# Patient Record
Sex: Female | Born: 2000 | Race: Black or African American | Hispanic: No | Marital: Single | Smoking: Never smoker
Health system: Southern US, Community
[De-identification: ages and names within clinical notes are randomized; demographics above are authoritative.]

## PROBLEM LIST (undated history)

## (undated) DIAGNOSIS — F909 Attention-deficit hyperactivity disorder, unspecified type: Secondary | ICD-10-CM

## (undated) DIAGNOSIS — K219 Gastro-esophageal reflux disease without esophagitis: Secondary | ICD-10-CM

## (undated) DIAGNOSIS — R011 Cardiac murmur, unspecified: Secondary | ICD-10-CM

---

## 2021-08-20 ENCOUNTER — Encounter: Payer: Self-pay | Admitting: *Deleted

## 2021-08-20 ENCOUNTER — Emergency Department
Admission: EM | Admit: 2021-08-20 | Discharge: 2021-08-20 | Disposition: A | Discharge status: Home / Self Care | Payer: Medicaid Other | Attending: Emergency Medicine | Admitting: Emergency Medicine

## 2021-08-20 ENCOUNTER — Emergency Department: Payer: Medicaid Other

## 2021-08-20 ENCOUNTER — Other Ambulatory Visit: Payer: Self-pay

## 2021-08-20 DIAGNOSIS — R55 Syncope and collapse: Secondary | ICD-10-CM | POA: Diagnosis present

## 2021-08-20 DIAGNOSIS — E860 Dehydration: Secondary | ICD-10-CM | POA: Diagnosis not present

## 2021-08-20 DIAGNOSIS — R1084 Generalized abdominal pain: Secondary | ICD-10-CM | POA: Diagnosis not present

## 2021-08-20 DIAGNOSIS — R112 Nausea with vomiting, unspecified: Secondary | ICD-10-CM | POA: Diagnosis not present

## 2021-08-20 DIAGNOSIS — L0501 Pilonidal cyst with abscess: Secondary | ICD-10-CM | POA: Diagnosis not present

## 2021-08-20 LAB — URINALYSIS, COMPLETE (UACMP) WITH MICROSCOPIC
Glucose, UA: NEGATIVE mg/dL
Hgb urine dipstick: NEGATIVE
Ketones, ur: 80 mg/dL — AB
Leukocytes,Ua: NEGATIVE
Nitrite: NEGATIVE
Protein, ur: 30 mg/dL — AB
Specific Gravity, Urine: >1.03 — ABNORMAL HIGH (ref 1.005–1.030)
pH: 6.5 (ref 5.0–8.0)

## 2021-08-20 LAB — HEPATIC FUNCTION PANEL
ALT: 11 U/L (ref 0–44)
AST: 15 U/L (ref 15–41)
Albumin: 4.1 g/dL (ref 3.5–5.0)
Alkaline Phosphatase: 42 U/L (ref 38–126)
Bilirubin, Direct: <0.1 mg/dL (ref 0.0–0.2)
Total Bilirubin: 0.9 mg/dL (ref 0.3–1.2)
Total Protein: 7.4 g/dL (ref 6.5–8.1)

## 2021-08-20 LAB — CBC
HCT: 36.4 % (ref 36.0–46.0)
Hemoglobin: 12.4 g/dL (ref 12.0–15.0)
MCH: 29.5 pg (ref 26.0–34.0)
MCHC: 34.1 g/dL (ref 30.0–36.0)
MCV: 86.5 fL (ref 80.0–100.0)
Platelets: 315 10*3/uL (ref 150–400)
RBC: 4.21 MIL/uL (ref 3.87–5.11)
RDW: 13.2 % (ref 11.5–15.5)
WBC: 12.5 10*3/uL — ABNORMAL HIGH (ref 4.0–10.5)
nRBC: 0 % (ref 0.0–0.2)

## 2021-08-20 LAB — BASIC METABOLIC PANEL
Anion gap: 9 (ref 5–15)
BUN: 10 mg/dL (ref 6–20)
CO2: 24 mmol/L (ref 22–32)
Calcium: 9.1 mg/dL (ref 8.9–10.3)
Chloride: 106 mmol/L (ref 98–111)
Creatinine, Ser: 0.79 mg/dL (ref 0.44–1.00)
GFR, Estimated: >60 mL/min (ref >60)
Glucose, Bld: 110 mg/dL — ABNORMAL HIGH (ref 70–99)
Potassium: 3.3 mmol/L — ABNORMAL LOW (ref 3.5–5.1)
Sodium: 139 mmol/L (ref 135–145)

## 2021-08-20 LAB — POC URINE PREG, ED: Preg Test, Ur: NEGATIVE

## 2021-08-20 LAB — LIPASE, BLOOD: Lipase: 19 U/L (ref 11–51)

## 2021-08-20 IMAGING — CT CT ABD-PELV W/ CM
2 of 4 series · 16 of 46 positions shown, 18 images · IV contrast (APPLIED)
Comparison: None.

CLINICAL DATA: Nonlocalized abdominal pain. Syncopal episode with
pain in lower back/sacral area.

EXAM:
CT ABDOMEN AND PELVIS WITH CONTRAST
TECHNIQUE: Multidetector CT imaging of the abdomen and pelvis was performed
using the standard protocol following bolus administration of
intravenous contrast.
CONTRAST:  75mL OMNIPAQUE IOHEXOL 350 MG/ML SOLN

[Series 2: routine abd/pel with · axial · 0.86mm/px · z∈[-990,-580]mm · 13 of 90 slices shown, 15 images]
[im 4/90  soft-tissue]
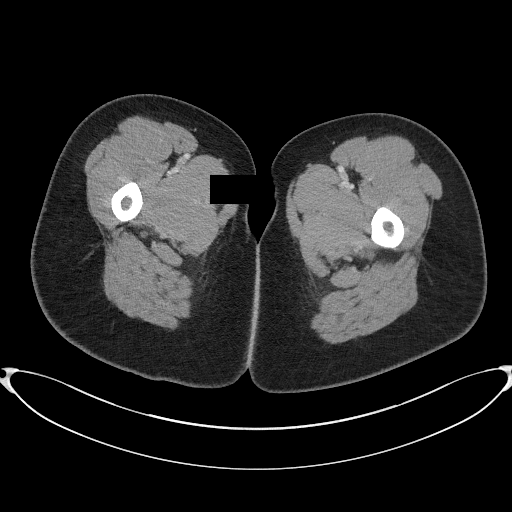
[im 4/90  bone]
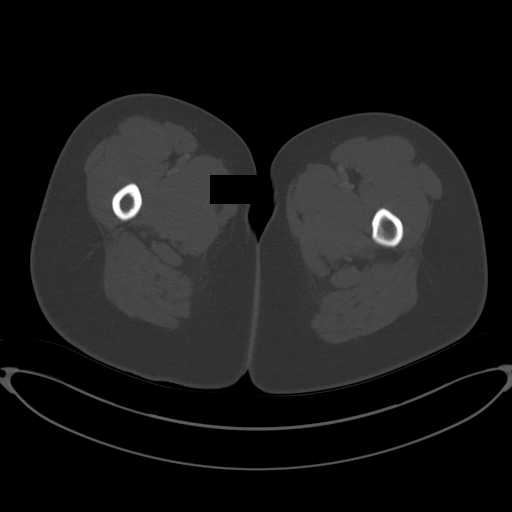
[im 12/90  soft-tissue]
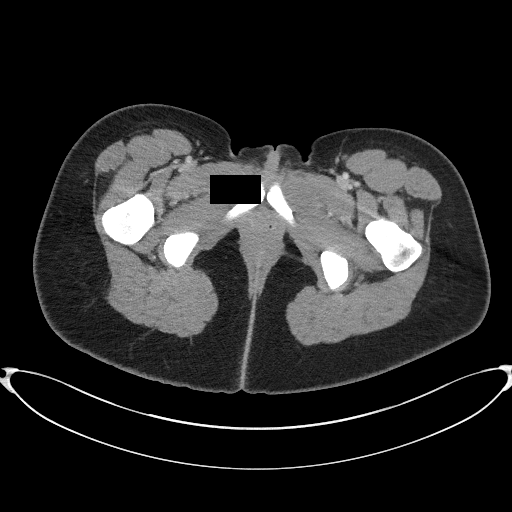
[im 19/90  soft-tissue]
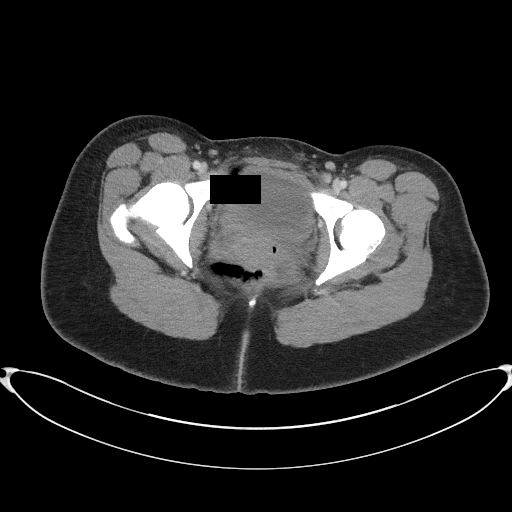
[im 26/90  soft-tissue]
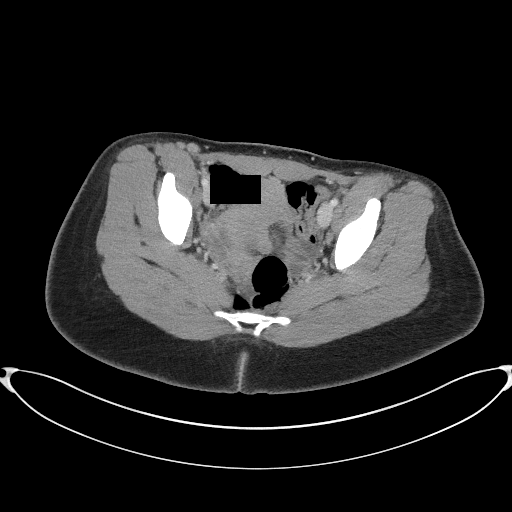
[im 30/90  soft-tissue]
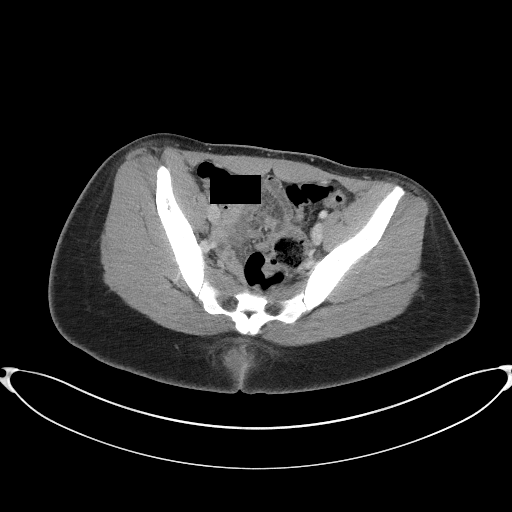
[im 38/90  soft-tissue]
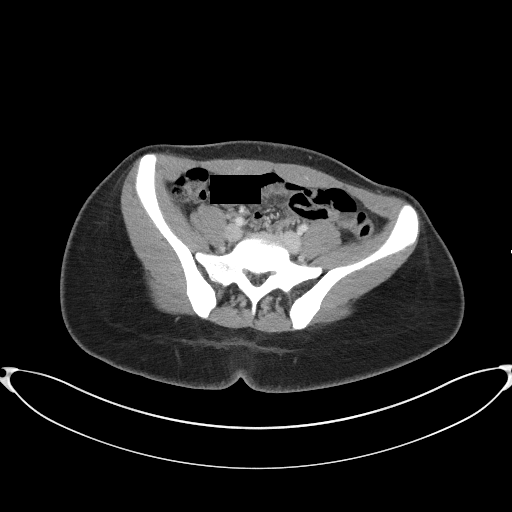
[im 45/90  soft-tissue]
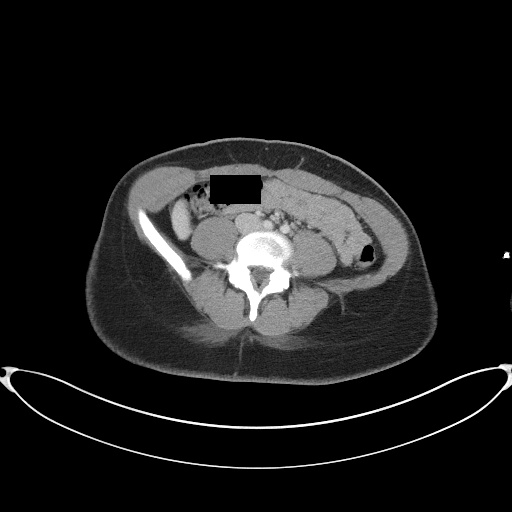
[im 52/90  soft-tissue]
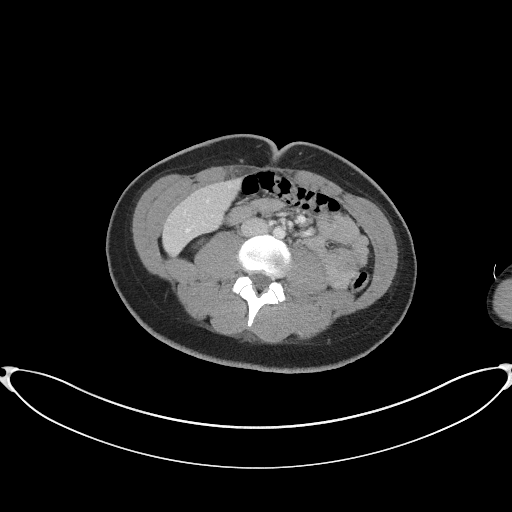
[im 60/90  soft-tissue]
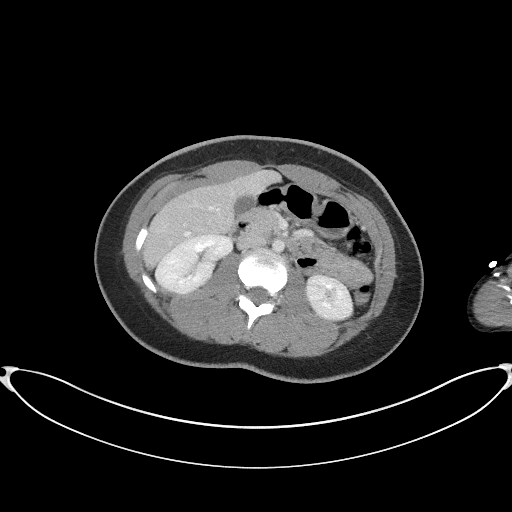
[im 60/90  bone]
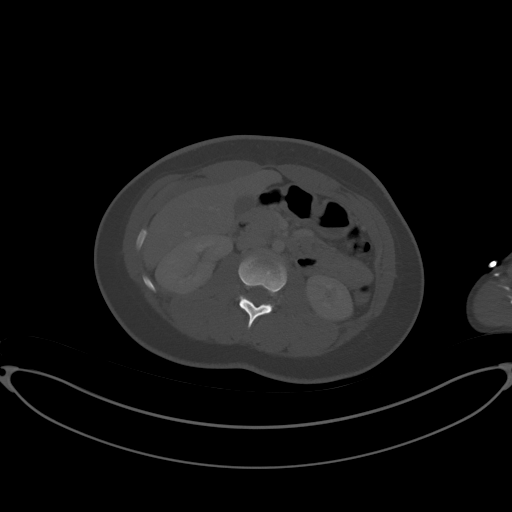
[im 64/90  soft-tissue]
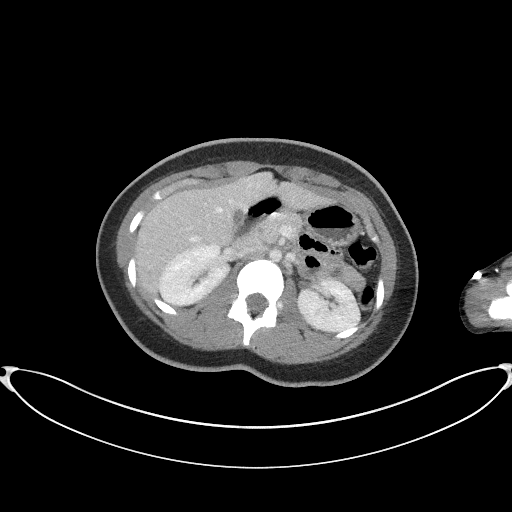
[im 71/90  soft-tissue]
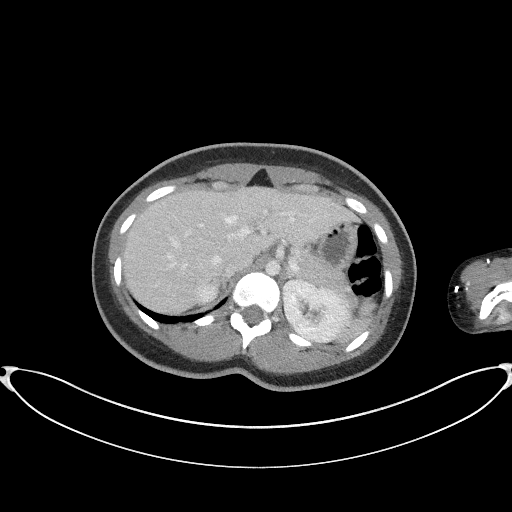
[im 78/90  soft-tissue]
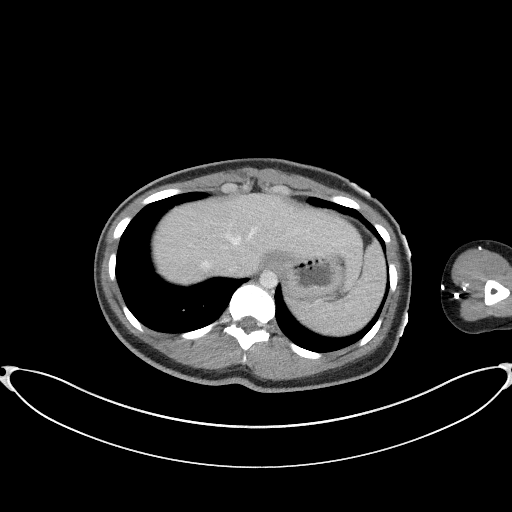
[im 86/90  soft-tissue]
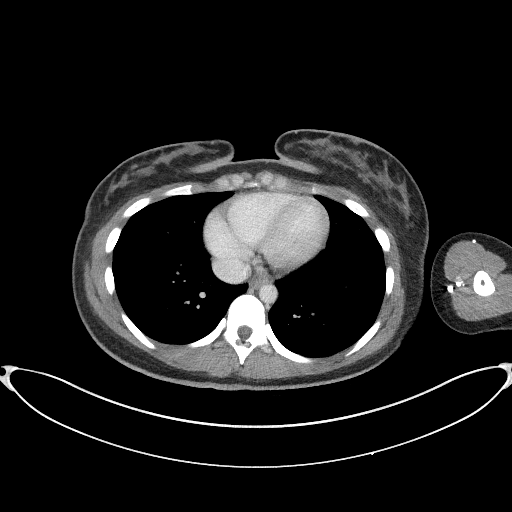

[Series 5: coronal st · coronal · 0.78mm/px · 3 of 88 slices shown]
[im 30/88  soft-tissue]
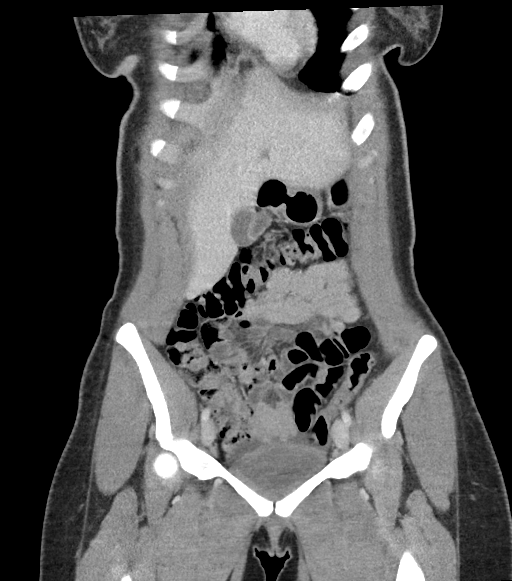
[im 39/88  soft-tissue]
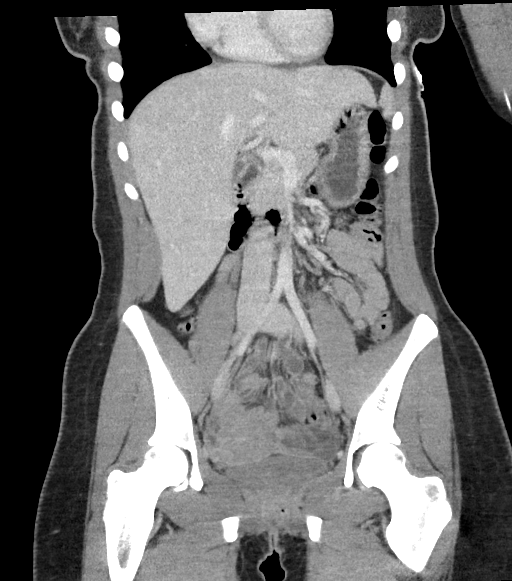
[im 49/88  soft-tissue]
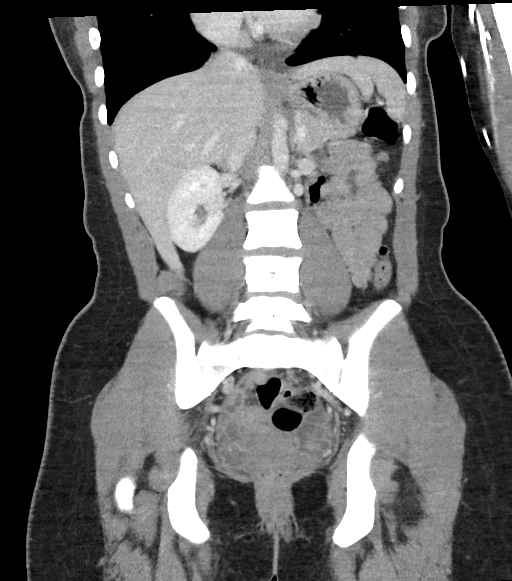

[16 of 46 positions shown; findings below may reference images not displayed]

FINDINGS: Lower chest: Clear lung bases. Normal heart size without pericardial
or pleural effusion.

Hepatobiliary: Normal liver. Normal gallbladder, without biliary
ductal dilatation.

Pancreas: Normal, without mass or ductal dilatation.

Spleen: Normal in size, without focal abnormality.

Adrenals/Urinary Tract: Normal adrenal glands. Normal kidneys,
without hydronephrosis. Normal urinary bladder.

Stomach/Bowel: Normal stomach, without wall thickening. Normal
colon. The appendix is normal on coronal image 34. Terminal ileum is
not well delineated.

Normal small bowel.

Vascular/Lymphatic: Normal caliber of the aorta and branch vessels.
No abdominopelvic adenopathy.

Reproductive: Normal uterus and adnexa.

Other: No significant free fluid.  No free intraperitoneal air.

Musculoskeletal: Soft tissue density superficial to the sacrum at
4.0 x 3.1 cm on 59/2. No acute osseous abnormality.
IMPRESSION: 1.  No acute process in the abdomen or pelvis.
2. Soft tissue density superficial to the sacrum in this patient
with reported history of sacral pain. Considerations include
pilonidal cyst, hematoma, or phlegmon. Consider physical exam
correlation.

## 2021-08-20 MED ORDER — KETOROLAC TROMETHAMINE 30 MG/ML IJ SOLN
30.0000 mg | Freq: Once | INTRAMUSCULAR | Status: AC
  Administered 2021-08-20: 30 mg via INTRAVENOUS
  Filled 2021-08-20: qty 1

## 2021-08-20 MED ORDER — IOHEXOL 350 MG/ML SOLN
75.0000 mL | Freq: Once | INTRAVENOUS | Status: AC | PRN
  Administered 2021-08-20: 75 mL via INTRAVENOUS

## 2021-08-20 MED ORDER — SODIUM CHLORIDE 0.9 % IV BOLUS (SEPSIS)
1000.0000 mL | Freq: Once | INTRAVENOUS | Status: AC
  Administered 2021-08-20: 1000 mL via INTRAVENOUS

## 2021-08-20 MED ORDER — LIDOCAINE-EPINEPHRINE (PF) 2 %-1:200000 IJ SOLN
10.0000 mL | Freq: Once | INTRAMUSCULAR | Status: AC
  Administered 2021-08-20: 10 mL via INTRADERMAL
  Filled 2021-08-20: qty 20

## 2021-08-20 MED ORDER — LIDOCAINE-EPINEPHRINE (PF) 2 %-1:200000 IJ SOLN
10.0000 mL | Freq: Once | INTRAMUSCULAR | Status: DC
Start: 2021-08-20 — End: 2021-08-20
  Filled 2021-08-20: qty 10

## 2021-08-20 MED ORDER — ONDANSETRON HCL 4 MG/2ML IJ SOLN
4.0000 mg | Freq: Once | INTRAMUSCULAR | Status: AC
  Administered 2021-08-20: 4 mg via INTRAVENOUS
  Filled 2021-08-20: qty 2

## 2021-08-20 NOTE — Discharge Instructions (Addendum)
You may alternate Tylenol 1000 mg every 6 hours as needed for pain, fever and Ibuprofen 800 mg every 8 hours as needed for pain, fever.  Please take Ibuprofen with food.  Do not take more than 4000 mg of Tylenol (acetaminophen) in a 24 hour period.  You may remove the packing from your wound in the next 3 days.  You may do this by just pulling on the end of the gauze gently and it will come out all in 1 piece.  If it comes out before 3 days, that is okay.  You may apply warm compresses and soak in a warm bath to keep this wound open and draining.  You do not need to be on antibiotics.  This is a cyst that has become infected.  These can recur.  I recommend you follow-up with general surgery as you may need to have the cyst excised as an outpatient.  Your lab work, EKG, CT of your abdomen pelvis was otherwise reassuring.  Steps to find a Primary Care Provider (PCP):  Call [phone] or [phone] to access "[facility] Find a Doctor Service."  2.  You may also go on the [facility] website at [email or web address]

## 2021-08-20 NOTE — ED Triage Notes (Signed)
Pain and "feels like a bump" in the sacral area and radiates to the right buttocks. Says she was watching TV, got sweaty, nauseated, vomited x 1. Started to loose her vision, walked to the living room, and her girlfriend said she then passed out,

## 2021-08-20 NOTE — ED Provider Notes (Signed)
[facility] Emergency Department Provider Note  ____________________________________________   Event Date/Time   First MD Initiated Contact with Patient 08/20/21 [phone]     (approximate)  I have reviewed the triage vital signs and the nursing notes.   HISTORY  Chief Complaint Loss of Consciousness    HPI Peggy Klein is a 20 y.o. female with no significant past medical history who presents to the emergency department with 2 days of buttock pain, swelling.  States she has a knot to this area that has not been draining.  She has never had anything like this before.  She also states tonight she had a syncopal event.  She says that she has episodes of syncope normally with her menstrual cycles but is not currently on her menstrual cycle.  States that she was sitting down and suddenly felt hot, sweaty, nauseated and then vomited.  States she got up and went into another room and her vision went black and she passed out.  She states she did fall the ground and think she hit her head but no headache, neck pain or neck stiffness, numbness, tingling or weakness.  No preceding chest pain or shortness of breath.  No history of CAD or arrhythmia.  No history of PE, DVT, exogenous estrogen use, recent fractures, surgery, trauma, hospitalization, prolonged travel or other immobilization. No lower extremity swelling or pain. No calf tenderness.    States she is having some generalized abdominal pain.  She denies fevers, chills, diarrhea, dysuria, hematuria, vaginal bleeding or discharge.         History reviewed. No pertinent past medical history.  There are no problems to display for this patient.   History reviewed. No pertinent surgical history.  Prior to Admission medications   Not on File    Allergies Patient has no allergy information on record.  No family history on file.  Social History Social History   Substance Use Topics   Alcohol use: Not  Currently   Drug use: Not Currently    Review of Systems Constitutional: No fever. Eyes: No visual changes. ENT: No sore throat. Cardiovascular: Denies chest pain. Respiratory: Denies shortness of breath. Gastrointestinal: + nausea, vomiting.  No diarrhea. Genitourinary: Negative for dysuria. Musculoskeletal: Negative for back pain. Skin: Negative for rash. Neurological: Negative for focal weakness or numbness.  ____________________________________________   PHYSICAL EXAM:  VITAL SIGNS: ED Triage Vitals  Enc Vitals Group     BP 08/20/21 0124 119/73     Pulse Rate 08/20/21 0124 (!) 101     Resp 08/20/21 0124 16     Temp 08/20/21 0132 98.8 F (37.1 C)     Temp Source 08/20/21 0132 Oral     SpO2 08/20/21 0124 100 %     Weight 08/20/21 0128 180 lb (81.6 kg)     Height 08/20/21 0128 5\' 4"  (1.626 m)     Head Circumference --      Peak Flow --      Pain Score 08/20/21 0128 10     Pain Loc --      Pain Edu? --      Excl. in GC? --    CONSTITUTIONAL: Alert and oriented and responds appropriately to questions. Well-appearing; well-nourished HEAD: Normocephalic, atraumatic EYES: Conjunctivae clear, pupils appear equal, EOM appear intact ENT: normal nose; moist mucous membranes NECK: Supple, normal ROM, no midline spinal tenderness or step-off or deformity CARD: RRR; S1 and S2 appreciated; no murmurs, no clicks, no rubs, no gallops  RESP: Normal chest excursion without splinting or tachypnea; breath sounds clear and equal bilaterally; no wheezes, no rhonchi, no rales, no hypoxia or respiratory distress, speaking full sentences ABD/GI: Normal bowel sounds; non-distended; soft, diffusely tender throughout the abdomen without guarding or rebound BACK: The back appears normal; patient has indurated tender area to the upper gluteal cleft more on the right side with some induration and warmth but no significant redness, fluctuance.  No drainage. EXT: Normal ROM in all joints; no  deformity noted, no edema; no cyanosis, no calf tenderness or calf swelling SKIN: Normal color for age and race; warm; no rash on exposed skin NEURO: Moves all extremities equally, normal sensation diffusely, normal speech, no facial asymmetry PSYCH: The patient's mood and manner are appropriate.  ____________________________________________   LABS (all labs ordered are listed, but only abnormal results are displayed)  Labs Reviewed  BASIC METABOLIC PANEL - Abnormal; Notable for the following components:      Result Value   Potassium 3.3 (*)    Glucose, Bld 110 (*)    All other components within normal limits  CBC - Abnormal; Notable for the following components:   WBC 12.5 (*)    All other components within normal limits  URINALYSIS, COMPLETE (UACMP) WITH MICROSCOPIC - Abnormal; Notable for the following components:   Specific Gravity, Urine >1.030 (*)    Bilirubin Urine SMALL (*)    Ketones, ur 80 (*)    Protein, ur 30 (*)    Bacteria, UA RARE (*)    All other components within normal limits  AEROBIC CULTURE W GRAM STAIN (SUPERFICIAL SPECIMEN)  HEPATIC FUNCTION PANEL  LIPASE, BLOOD  POC URINE PREG, ED   ____________________________________________  EKG   EKG Interpretation  Date/Time:  Sunday August 20 2021 01:26:53 EDT Ventricular Rate:  108 PR Interval:  124 QRS Duration: 74 QT Interval:  332 QTC Calculation: 444 R Axis:   77 Text Interpretation: Sinus tachycardia Otherwise normal ECG Confirmed by Rochele Raring [phone]) on 08/20/2021 3:08:04 AM        ____________________________________________  RADIOLOGY Normajean Baxter Agamjot Kilgallon, personally viewed and evaluated these images (plain radiographs) as part of my medical decision making, as well as reviewing the written report by the radiologist.  ED MD interpretation: CT scan shows no acute process within the abdomen or pelvis.  Official radiology report(s): CT ABDOMEN PELVIS W CONTRAST  Result Date:  08/20/2021 CLINICAL DATA:  Nonlocalized abdominal pain. Syncopal episode with pain in lower back/sacral area. EXAM: CT ABDOMEN AND PELVIS WITH CONTRAST TECHNIQUE: Multidetector CT imaging of the abdomen and pelvis was performed using the standard protocol following bolus administration of intravenous contrast. CONTRAST:  41mL OMNIPAQUE IOHEXOL 350 MG/ML SOLN COMPARISON:  None. FINDINGS: Lower chest: Clear lung bases. Normal heart size without pericardial or pleural effusion. Hepatobiliary: Normal liver. Normal gallbladder, without biliary ductal dilatation. Pancreas: Normal, without mass or ductal dilatation. Spleen: Normal in size, without focal abnormality. Adrenals/Urinary Tract: Normal adrenal glands. Normal kidneys, without hydronephrosis. Normal urinary bladder. Stomach/Bowel: Normal stomach, without wall thickening. Normal colon. The appendix is normal on coronal image 34. Terminal ileum is not well delineated. Normal small bowel. Vascular/Lymphatic: Normal caliber of the aorta and branch vessels. No abdominopelvic adenopathy. Reproductive: Normal uterus and adnexa. Other: No significant free fluid.  No free intraperitoneal air. Musculoskeletal: Soft tissue density superficial to the sacrum at 4.0 x 3.1 cm on 59/2. No acute osseous abnormality. IMPRESSION: 1.  No acute process in the abdomen or pelvis. 2. Soft tissue density  superficial to the sacrum in this patient with reported history of sacral pain. Considerations include pilonidal cyst, hematoma, or phlegmon. Consider physical exam correlation. Electronically Signed   By: Jeronimo Greaves M.D.   On: 08/20/2021 05:08    ____________________________________________   PROCEDURES  Procedure(s) performed (including Critical Care):  Procedures  INCISION AND DRAINAGE Performed by: Baxter Hire Gaither Biehn Consent: Verbal consent obtained. Risks and benefits: risks, benefits and alternatives were discussed Type: abscess  Body area: Gluteal cleft  Anesthesia:  local infiltration  Incision was made with a scalpel.  Local anesthetic: lidocaine 2% with epinephrine  Anesthetic total: 4 ml  Complexity: complex Blunt dissection to break up loculations  Drainage: purulent  Drainage amount: large amount of purulent drainage  Packing material: 1 in iodoform gauze  Patient tolerance: Patient tolerated the procedure well with no immediate complications.    ____________________________________________   INITIAL IMPRESSION / ASSESSMENT AND PLAN / ED COURSE  As part of my medical decision making, I reviewed the following data within the electronic MEDICAL RECORD NUMBER Nursing notes reviewed and incorporated, Labs reviewed , EKG interpreted , Old EKG reviewed, Old chart reviewed, CT reviewed, and Notes from prior ED visits         Patient here with syncopal event.  Sounds like a vasovagal episode.  Doubt ACS, PE, arrhythmia.  EKG shows sinus tachycardia but no delta wave, interval abnormality, ischemia.  She also has a pilonidal cyst with associated abscess.  We will incise and drain in the ED.  No surrounding cellulitis.  Also complaining of abdominal pain with vomiting today.  Abdominal exam reveals tenderness diffusely.  Differential includes colitis, diverticulitis, appendicitis, pancreatitis, cholecystitis, UTI, pyelonephritis.  Will obtain abdominal labs, CT of abdomen pelvis.  Will give Toradol, Zofran, IV fluids.  ED PROGRESS  Labs show minimal leukocytosis.  Urine shows large ketones and elevated specific gravity suggestive of dehydration.  No sign of UTI.  Patient receiving 2 L of IV fluids here.  CT of abdomen pelvis shows no acute pathology within the abdomen or pelvis.  Appendix appears normal.  Pregnancy test negative.  Patient tolerated incision and drainage of pilonidal abscess without difficulty.  Wound culture sent.  I do not feel she needs to be on antibiotics.  Discussed wound care instructions.  We will have her follow-up with  general surgery as an outpatient as we have discussed that these can recur and may need outpatient excision.  Patient comfortable with this plan and ready for discharge home.   At this time, I do not feel there is any life-threatening condition present. I have reviewed, interpreted and discussed all results (EKG, imaging, lab, urine as appropriate) and exam findings with patient/family. I have reviewed nursing notes and appropriate previous records.  I feel the patient is safe to be discharged home without further emergent workup and can continue workup as an outpatient as needed. Discussed usual and customary return precautions. Patient/family verbalize understanding and are comfortable with this plan.  Outpatient follow-up has been provided as needed. All questions have been answered.  ____________________________________________   FINAL CLINICAL IMPRESSION(S) / ED DIAGNOSES  Final diagnoses:  Pilonidal cyst with abscess  Generalized abdominal pain  Nausea and vomiting in adult  Vasovagal syncope  Dehydration     ED Discharge Orders     None       *Please note:  Peggy Klein was evaluated in Emergency Department on 08/20/2021 for the symptoms described in the history of present illness. She was evaluated in the  context of the global COVID-19 pandemic, which necessitated consideration that the patient might be at risk for infection with the SARS-CoV-2 virus that causes COVID-19. Institutional protocols and algorithms that pertain to the evaluation of patients at risk for COVID-19 are in a state of rapid change based on information released by regulatory bodies including the CDC and federal and state organizations. These policies and algorithms were followed during the patient's care in the ED.  Some ED evaluations and interventions may be delayed as a result of limited staffing during and the pandemic.*   Note:  This document was prepared using Dragon voice recognition software and  may include unintentional dictation errors.    Evolet Salminen, Layla MawKristen N, DO 08/20/21 [phone]

## 2021-08-20 NOTE — ED Triage Notes (Signed)
Pt arrives via [facility], from home, per medic report, pt had syncopal episode d/t pain in the lower back (sacral) area with swelling to the area. Going on for about 3 days. Denies fevers. Prior to passing out, she had blurry vision. Hx of syncopal episodes. HR 120's, improved to 90's. NSR. 117/72, 99% RA, cbg 80.

## 2021-08-22 LAB — AEROBIC CULTURE W GRAM STAIN (SUPERFICIAL SPECIMEN)

## 2022-08-23 ENCOUNTER — Other Ambulatory Visit: Payer: Self-pay

## 2022-08-23 ENCOUNTER — Ambulatory Visit
Admission: EM | Admit: 2022-08-23 | Discharge: 2022-08-23 | Disposition: A | Discharge status: Other Acute Inpatient Hospital | Payer: Medicaid Other | Source: Home / Self Care | Attending: Family Medicine | Admitting: Family Medicine

## 2022-08-23 ENCOUNTER — Observation Stay
Admission: EM | Admit: 2022-08-23 | Discharge: 2022-08-24 | Disposition: A | Discharge status: Home / Self Care | Payer: Medicaid Other | Attending: Internal Medicine | Admitting: Internal Medicine

## 2022-08-23 ENCOUNTER — Emergency Department: Payer: Medicaid Other

## 2022-08-23 DIAGNOSIS — Z20822 Contact with and (suspected) exposure to covid-19: Secondary | ICD-10-CM | POA: Insufficient documentation

## 2022-08-23 DIAGNOSIS — J029 Acute pharyngitis, unspecified: Secondary | ICD-10-CM | POA: Diagnosis not present

## 2022-08-23 DIAGNOSIS — B2790 Infectious mononucleosis, unspecified without complication: Secondary | ICD-10-CM

## 2022-08-23 DIAGNOSIS — J038 Acute tonsillitis due to other specified organisms: Secondary | ICD-10-CM | POA: Diagnosis not present

## 2022-08-23 DIAGNOSIS — M542 Cervicalgia: Secondary | ICD-10-CM | POA: Insufficient documentation

## 2022-08-23 DIAGNOSIS — J039 Acute tonsillitis, unspecified: Principal | ICD-10-CM | POA: Diagnosis present

## 2022-08-23 DIAGNOSIS — R070 Pain in throat: Secondary | ICD-10-CM | POA: Diagnosis present

## 2022-08-23 LAB — BASIC METABOLIC PANEL
Anion gap: 8 (ref 5–15)
BUN: <5 mg/dL — ABNORMAL LOW (ref 6–20)
CO2: 22 mmol/L (ref 22–32)
Calcium: 8.6 mg/dL — ABNORMAL LOW (ref 8.9–10.3)
Chloride: 108 mmol/L (ref 98–111)
Creatinine, Ser: 0.62 mg/dL (ref 0.44–1.00)
GFR, Estimated: >60 mL/min (ref >60)
Glucose, Bld: 83 mg/dL (ref 70–99)
Potassium: 3.6 mmol/L (ref 3.5–5.1)
Sodium: 138 mmol/L (ref 135–145)

## 2022-08-23 LAB — CBC WITH DIFFERENTIAL/PLATELET
Abs Immature Granulocytes: 0.01 10*3/uL (ref 0.00–0.07)
Basophils Absolute: 0.1 10*3/uL (ref 0.0–0.1)
Basophils Relative: 1 %
Eosinophils Absolute: 0.1 10*3/uL (ref 0.0–0.5)
Eosinophils Relative: 1 %
HCT: 37.4 % (ref 36.0–46.0)
Hemoglobin: 12.1 g/dL (ref 12.0–15.0)
Immature Granulocytes: 0 %
Lymphocytes Relative: 46 %
Lymphs Abs: 4.4 10*3/uL — ABNORMAL HIGH (ref 0.7–4.0)
MCH: 27.6 pg (ref 26.0–34.0)
MCHC: 32.4 g/dL (ref 30.0–36.0)
MCV: 85.2 fL (ref 80.0–100.0)
Monocytes Absolute: 0.7 10*3/uL (ref 0.1–1.0)
Monocytes Relative: 7 %
Neutro Abs: 4.2 10*3/uL (ref 1.7–7.7)
Neutrophils Relative %: 45 %
Platelets: 261 10*3/uL (ref 150–400)
RBC: 4.39 MIL/uL (ref 3.87–5.11)
RDW: 14.7 % (ref 11.5–15.5)
WBC: 9.2 10*3/uL (ref 4.0–10.5)
nRBC: 0 % (ref 0.0–0.2)

## 2022-08-23 LAB — MONONUCLEOSIS SCREEN: Mono Screen: POSITIVE — AB

## 2022-08-23 LAB — GROUP A STREP BY PCR: Group A Strep by PCR: NOT DETECTED

## 2022-08-23 LAB — SARS CORONAVIRUS 2 BY RT PCR: SARS Coronavirus 2 by RT PCR: NEGATIVE

## 2022-08-23 LAB — LACTIC ACID, PLASMA
Lactic Acid, Venous: 1.4 mmol/L (ref 0.5–1.9)
Lactic Acid, Venous: 1.2 mmol/L (ref 0.5–1.9)

## 2022-08-23 LAB — HIV ANTIBODY (ROUTINE TESTING W REFLEX): HIV Screen 4th Generation wRfx: NONREACTIVE

## 2022-08-23 MED ORDER — DEXAMETHASONE SODIUM PHOSPHATE 10 MG/ML IJ SOLN: 10.0000 mg | Freq: Four times a day (QID) | INTRAMUSCULAR | Status: DC

## 2022-08-23 MED ORDER — SODIUM CHLORIDE 0.9 % IV SOLN: INTRAVENOUS | Status: DC

## 2022-08-23 MED ORDER — SODIUM CHLORIDE 0.9 % IV SOLN
3.0000 g | Freq: Four times a day (QID) | INTRAVENOUS | Status: DC
  Administered 2022-08-23 – 2022-08-24 (×3): 3 g via INTRAVENOUS
  Filled 2022-08-23: qty 3
  Filled 2022-08-23: qty 8
  Filled 2022-08-23: qty 3
  Filled 2022-08-23: qty 8
  Filled 2022-08-23: qty 3

## 2022-08-23 MED ORDER — DEXAMETHASONE SODIUM PHOSPHATE 10 MG/ML IJ SOLN
10.0000 mg | Freq: Three times a day (TID) | INTRAMUSCULAR | Status: DC
  Administered 2022-08-23 – 2022-08-24 (×2): 10 mg via INTRAVENOUS
  Filled 2022-08-23 (×3): qty 1

## 2022-08-23 MED ORDER — IOHEXOL 300 MG/ML  SOLN
75.0000 mL | Freq: Once | INTRAMUSCULAR | Status: AC | PRN
  Administered 2022-08-23: 75 mL via INTRAVENOUS

## 2022-08-23 MED ORDER — DEXAMETHASONE SODIUM PHOSPHATE 10 MG/ML IJ SOLN
10.0000 mg | Freq: Once | INTRAMUSCULAR | Status: AC
  Administered 2022-08-23: 10 mg via INTRAVENOUS
  Filled 2022-08-23: qty 1

## 2022-08-23 MED ORDER — SODIUM CHLORIDE 0.9 % IV BOLUS
1000.0000 mL | Freq: Once | INTRAVENOUS | Status: AC
  Administered 2022-08-23: 1000 mL via INTRAVENOUS

## 2022-08-23 MED ORDER — ONDANSETRON HCL 4 MG/2ML IJ SOLN: 4.0000 mg | Freq: Four times a day (QID) | INTRAMUSCULAR | Status: DC | PRN

## 2022-08-23 MED ORDER — KETOROLAC TROMETHAMINE 30 MG/ML IJ SOLN
15.0000 mg | Freq: Once | INTRAMUSCULAR | Status: AC
  Administered 2022-08-23: 15 mg via INTRAVENOUS
  Filled 2022-08-23: qty 1

## 2022-08-23 MED ORDER — ENOXAPARIN SODIUM 40 MG/0.4ML IJ SOSY
40.0000 mg | PREFILLED_SYRINGE | INTRAMUSCULAR | Status: DC
  Administered 2022-08-23: 40 mg via SUBCUTANEOUS
  Filled 2022-08-23: qty 0.4

## 2022-08-23 MED ORDER — ACETAMINOPHEN 325 MG PO TABS: 650.0000 mg | ORAL_TABLET | Freq: Four times a day (QID) | ORAL | Status: DC | PRN

## 2022-08-23 MED ORDER — PIPERACILLIN-TAZOBACTAM 3.375 G IVPB 30 MIN
3.3750 g | Freq: Once | INTRAVENOUS | Status: AC
  Administered 2022-08-23: 3.375 g via INTRAVENOUS
  Filled 2022-08-23: qty 50

## 2022-08-23 MED ORDER — ACETAMINOPHEN 650 MG RE SUPP: 650.0000 mg | Freq: Four times a day (QID) | RECTAL | Status: DC | PRN

## 2022-08-23 MED ORDER — ONDANSETRON HCL 4 MG PO TABS: 4.0000 mg | ORAL_TABLET | Freq: Four times a day (QID) | ORAL | Status: DC | PRN

## 2022-08-23 MED ORDER — MORPHINE SULFATE (PF) 4 MG/ML IV SOLN
4.0000 mg | Freq: Once | INTRAVENOUS | Status: AC
  Administered 2022-08-23: 4 mg via INTRAVENOUS
  Filled 2022-08-23: qty 1

## 2022-08-23 NOTE — ED Notes (Signed)
Pt away at imaging.

## 2022-08-23 NOTE — ED Notes (Signed)
Patient is being discharged from the Urgent Care and sent to the Emergency Department via EMS . Per Katha Cabal, MD, patient is in need of higher level of care due to peritonsillar abscess. Patient is aware and verbalizes understanding of plan of care.  Vitals:   08/23/22 0904  BP: 127/82  Pulse: 100  Temp: 99 F (37.2 C)  SpO2: 96%

## 2022-08-23 NOTE — Assessment & Plan Note (Addendum)
We will place patient empirically on IV antibiotic therapy with Unasyn adjusted to renal function Place patient on IV Decadron 10 mg every 8 hours Monospot test is positive Clear liquid diet Tylenol as needed for fever Supportive care

## 2022-08-23 NOTE — ED Notes (Signed)
Blood-work collected by previous staff member.

## 2022-08-23 NOTE — ED Provider Notes (Signed)
[facility] Provider Note    Event Date/Time   First MD Initiated Contact with Patient 08/23/22 1121     (approximate)   History   Abscess and Oral Swelling   HPI  Peggy Klein is a 21 y.o. female  here with sore throat. Pt reports 2 weeks of progressively worsening sore throat, particularly worse over the past 1 day.  She particularly feels pain on the left and has had severe pain with swallowing. Reports some difficulty as well. No SOB. She has a h/o recurrent tonsillitis/issues with this though has not seen an ENT. Denies any known sick contacts.      Physical Exam   Triage Vital Signs: ED Triage Vitals  Enc Vitals Group     BP 08/23/22 1111 137/76     Pulse Rate 08/23/22 1111 75     Resp 08/23/22 1111 16     Temp 08/23/22 1111 99.1 F (37.3 C)     Temp Source 08/23/22 1111 Oral     SpO2 08/23/22 1111 100 %     Weight 08/23/22 1112 150 lb (68 kg)     Height 08/23/22 1112 5\' 4"  (1.626 m)     Head Circumference --      Peak Flow --      Pain Score 08/23/22 1113 10     Pain Loc --      Pain Edu? --      Excl. in GC? --     Most recent vital signs: Vitals:   08/23/22 1305 08/23/22 1513  BP: (!) 123/90 121/81  Pulse: 87 77  Resp: 16 16  Temp:  98.7 F (37.1 C)  SpO2: 100% 97%     General: Awake, no distress.  CV:  Good peripheral perfusion.  Resp:  Normal effort.  Abd:  No distention.  Other:  Marked bilateral tonsillar swelling and edema, with pronounced cervical lymphadenopathy worse on the left than the right.  Slightly muffled voice but able to tolerate secretions without difficulty.  No stridor.   ED Results / Procedures / Treatments   Labs (all labs ordered are listed, but only abnormal results are displayed) Labs Reviewed  CBC WITH DIFFERENTIAL/PLATELET - Abnormal; Notable for the following components:      Result Value   Lymphs Abs 4.4 (*)    All other components within normal limits  BASIC METABOLIC PANEL -  Abnormal; Notable for the following components:   BUN <5 (*)    Calcium 8.6 (*)    All other components within normal limits  MONONUCLEOSIS SCREEN - Abnormal; Notable for the following components:   Mono Screen POSITIVE (*)    All other components within normal limits  CULTURE, BLOOD (SINGLE)  LACTIC ACID, PLASMA  LACTIC ACID, PLASMA  POC URINE PREG, ED     EKG    RADIOLOGY CT neck: Bilateral [place] tonsillar swelling with phlegmon but no drainable abscess, prominent cervical lymph nodes   I also independently reviewed and agree with radiologist interpretations.   PROCEDURES:  Critical Care performed: No   MEDICATIONS ORDERED IN ED: Medications  dexamethasone (DECADRON) injection 10 mg (10 mg Intravenous Given 08/23/22 1153)  piperacillin-tazobactam (ZOSYN) IVPB 3.375 g (0 g Intravenous Stopped 08/23/22 1235)  morphine (PF) 4 MG/ML injection 4 mg (4 mg Intravenous Given 08/23/22 1300)  ketorolac (TORADOL) 30 MG/ML injection 15 mg (15 mg Intravenous Given 08/23/22 1302)  sodium chloride 0.9 % bolus 1,000 mL (0 mLs Intravenous Stopped 08/23/22 1514)  iohexol (  OMNIPAQUE) 300 MG/ML solution 75 mL (75 mLs Intravenous Contrast Given 08/23/22 1316)     IMPRESSION / MDM / ASSESSMENT AND PLAN / ED COURSE  I reviewed the triage vital signs and the nursing notes.                               Ddx:  Differential includes the following, with pertinent life- or limb-threatening emergencies considered:  Severe pharyngitis, tonsillar abscess, peritonsillar abscess, mononucleosis, lymphoma  Patient's presentation is most consistent with acute presentation with potential threat to life or bodily function.  MDM:  21 year old female here with severe tonsillitis and pain with swallowing.  Exam is more consistent with bilateral tonsillitis although peritonsillar abscess concerning given the patient's duration of symptoms and history.  She is afebrile here.  CT scan obtained, shows  significant tonsillitis with developing phlegmon but no drainable abscess.  She has significant lymphadenopathy which I suspect is reactive and CBC shows no evidence of marked leukocytosis.  BMP unremarkable.  Lactic acid normal.  No evidence of sepsis.  Given the degree of her tonsillar swelling and developing phlegmon, will place on empiric antibiotics, Decadron.  Discussed case with Dr. Andee Poles of ENT who will see the patient.  Will admit to medicine for observation.  IV fluids given.   MEDICATIONS GIVEN IN ED: Medications  dexamethasone (DECADRON) injection 10 mg (10 mg Intravenous Given 08/23/22 1153)  piperacillin-tazobactam (ZOSYN) IVPB 3.375 g (0 g Intravenous Stopped 08/23/22 1235)  morphine (PF) 4 MG/ML injection 4 mg (4 mg Intravenous Given 08/23/22 1300)  ketorolac (TORADOL) 30 MG/ML injection 15 mg (15 mg Intravenous Given 08/23/22 1302)  sodium chloride 0.9 % bolus 1,000 mL (0 mLs Intravenous Stopped 08/23/22 1514)  iohexol (OMNIPAQUE) 300 MG/ML solution 75 mL (75 mLs Intravenous Contrast Given 08/23/22 1316)     Consults:  Dr. Andee Poles ENT Hospitalist Dr. Joylene Igo  EMR reviewed       FINAL CLINICAL IMPRESSION(S) / ED DIAGNOSES   Final diagnoses:  Acute tonsillitis, unspecified etiology     Rx / DC Orders   ED Discharge Orders     None        Note:  This document was prepared using Dragon voice recognition software and may include unintentional dictation errors.   Shaune Pollack, MD 08/23/22 [phone]

## 2022-08-23 NOTE — ED Triage Notes (Signed)
Patient arrived by EMS from UC with c/o peritonsillar abscess. Swelling bilaterally to neck. Reports significant discomfort and pain on left side. Reports symptoms X1 week.   A&O x4 per EMS  EMS vitals: 126/84 100% RA 90HR 99.9oral

## 2022-08-23 NOTE — Consult Note (Signed)
..  Peggy Klein, Peggy Klein [id number] 2000-12-16 Shaune Pollack, MD  Reason for Consult: Tonsillitis  HPI: 21 y.o. female presented to ER with several weeks of progressive worsening throat pain.  Patient reports difficulty swallowing and some issues breathing at night over last 2 to 3 days.  She reports history of tonsillitis in past.  No fever, chills, n/v.  She does report odynophagia, neck pain, post nasal drip.  She has been taking OTC pain medications without improvement.  Evaluated at [facility] and transported to [facility] via EMS for evaluation and admission.   Allergies:  Allergies  Allergen Reactions   Omnicef [Cefdinir] Other (See Comments)    Pt states "turns her stool red"    ROS: Review of systems normal other than 12 systems except per HPI.  PMH: History reviewed. No pertinent past medical history.  FH: No family history on file.  SH:  Social History   Socioeconomic History   Marital status: Single    Spouse name: Not on file   Number of children: Not on file   Years of education: Not on file   Highest education level: Not on file  Occupational History   Not on file  Tobacco Use   Smoking status: Never   Smokeless tobacco: Never  Vaping Use   Vaping Use: Never used  Substance and Sexual Activity   Alcohol use: Not Currently   Drug use: Not Currently   Sexual activity: Not Currently  Other Topics Concern   Not on file  Social History Narrative   Not on file   Social Determinants of Health   Financial Resource Strain: Not on file  Food Insecurity: Not on file  Transportation Needs: Not on file  Physical Activity: Not on file  Stress: Not on file  Social Connections: Not on file  Intimate Partner Violence: Not on file    PSH: History reviewed. No pertinent surgical history.  Physical  Exam: GEN- NAD supine in bed NEURO- CN 2-12 intact NOSE- clear anteriorly OC/OP- 3+ tonsils exudate medially, no uvular deviation, no lateral bulging NECK-  lymphadenopathy L > R 2cm multiple levels RESP- unlabored CARD-  RRR  CT- tonsil hypertrophy with no abscess  Mono- positive  A/P: Mono Tonsillitis  Plan:  Widely patent airway.  Recommend continued antibiotics given timing and possible superinfection on top of mono.  Once able to tolerate PO can switch to oral steroids and antibiotics and discharge home most likely tomorrow with Sterapred DS 6 day taper.  I would like to see patient in my office next week for follow up appointment.   Roney Mans Helio Lack 08/23/2022 3:03 PM

## 2022-08-23 NOTE — ED Triage Notes (Addendum)
Pt c/o sore throat, pt states mostly LT side of throat x couple days, throat inflamed hurting to swallow. Denies any known fevers at home. Pt states it also started with sneezing, pt took home covid test Monday and Wednesday last week (negative results). Pt reports she has been taking ibuprofen for pain

## 2022-08-23 NOTE — H&P (Signed)
History and Physical    Patient: Peggy Klein YTK:[id number] DOB: Nov 22, 2001 DOA: 08/23/2022 DOS: the patient was seen and examined on 08/23/2022 PCP: System, Provider Not In  Patient coming from: Home  Chief Complaint:  Chief Complaint  Patient presents with   Abscess   Oral Swelling   HPI: Peggy Klein is a 21 y.o. female with no significant past medical history who presents to the emergency room for evaluation of 2 weeks of nasal congestion, sore throat which has progressively worsened and is mostly on the left side as well as shortness of breath mostly at night and pain with swallowing.  She has pain with swallowing but is able to control her secretions.  She has been hoarse Patient has had 2 home COVID test returned negative She denies having any fever or chills.  She denies having any cough, no chest pain, no nausea, no abdominal pain, no vomiting, no drooling, no changes in her bowel habits, no urinary symptoms, no blurred vision no focal deficit. She has been on over-the-counter antidecongestants without any improvement in her symptoms and presented to the ER due to worsening swelling involving the left side of her neck. CT soft tissue neck showed swelling of the bilateral palatine tonsils consistent with tonsillitis with possible phlegmon on the left but no defined or drainable abscess. Prominent cervical chain lymph nodes measuring up to 2.0 cm on the left, likely reactive. Recommend clinical follow-up to resolution.  Review of Systems: As mentioned in the history of present illness. All other systems reviewed and are negative. History reviewed. No pertinent past medical history. History reviewed. No pertinent surgical history. Social History:  reports that she has never smoked. She has never used smokeless tobacco. She reports that she does not currently use alcohol. She reports that she does not currently use drugs.  Allergies  Allergen Reactions   Omnicef [Cefdinir]  Other (See Comments)    Pt states "turns her stool red"    No family history on file.  Prior to Admission medications   Medication Sig Start Date End Date Taking? Authorizing Provider  atomoxetine (STRATTERA) 40 MG capsule Take 40 mg by mouth every morning. 08/10/22   [provider]    Physical Exam: Vitals:   08/23/22 1142 08/23/22 1230 08/23/22 1305 08/23/22 1513  BP: 126/69  (!) 123/90 121/81  Pulse: 88 76 87 77  Resp:   16 16  Temp:    98.7 F (37.1 C)  TempSrc:    Oral  SpO2: 100% 100% 100% 97%  Weight:      Height:       Physical Exam Vitals and nursing note reviewed.  Constitutional:      Appearance: Normal appearance.  HENT:     Head: Normocephalic and atraumatic.     Nose: Nose normal.     Mouth/Throat:     Mouth: Mucous membranes are moist.  Eyes:     Conjunctiva/sclera: Conjunctivae normal.  Neck:     Comments: Tender lymphadenopathy on the left side of the neck Cardiovascular:     Rate and Rhythm: Normal rate and regular rhythm.  Pulmonary:     Effort: Pulmonary effort is normal.     Breath sounds: Normal breath sounds.  Abdominal:     General: Abdomen is flat. Bowel sounds are normal.     Palpations: Abdomen is soft.  Musculoskeletal:        General: Normal range of motion.     Cervical back: Normal range of motion and  neck supple.  Skin:    General: Skin is warm and dry.  Neurological:     General: No focal deficit present.     Mental Status: She is alert.  Psychiatric:        Mood and Affect: Mood normal.        Behavior: Behavior normal.     Data Reviewed: Relevant notes from primary care and specialist visits, past discharge summaries as available in EHR, including Care Everywhere. Prior diagnostic testing as pertinent to current admission diagnoses Updated medications and problem lists for reconciliation ED course, including vitals, labs, imaging, treatment and response to treatment Triage notes, nursing and pharmacy notes and  ED provider's notes Notable results as noted in HPI Labs reviewed.  Monoscreen positive Sodium 138, potassium 3.6, chloride 108, bicarb 22, glucose 83, BUN less than 5, creatinine 0.60, calcium 8.6, white count 9.2, hemoglobin 12.1, hematocrit 37.4, platelet count 261, lactic acid 1.4 Group A strep by PCR not detected, SARS coronavirus 2 PCR is negative There are no new results to review at this time.  Assessment and Plan: * Acute tonsillitis We will place patient empirically on IV antibiotic therapy with Unasyn adjusted to renal function Place patient on IV Decadron 10 mg every 8 hours Monospot test is positive Clear liquid diet Tylenol as needed for fever Supportive care      Advance Care Planning:   Code Status: Full Code   Consults: ENT  Family Communication: Greater than 50% of time was spent discussing patient's condition and plan of care with her at the bedside.  All questions and concerns have been addressed.  She verbalizes understanding and agrees with the plan.  Severity of Illness: This the appropriate patient status for this patient is INPATIENT. Inpatient status is judged to be reasonable and necessary in order to provide the required intensity of service to ensure the patient's safety. The patient's presenting symptoms, physical exam findings, and initial radiographic and laboratory data in the context of their chronic comorbidities is felt to place them at high risk for further clinical deterioration. Furthermore, it is not anticipated that the patient will be medically stable for discharge from the hospital within 2 midnights of admission.   * I certify that at the point of admission it is my clinical judgment that the patient will require inpatient hospital care spanning beyond 2 midnights from the point of admission due to high intensity of service, high risk for further deterioration and high frequency of surveillance required.*  Author: Lucile Shutters,  MD 08/23/2022 4:34 PM  For on call review [email or web address].

## 2022-08-23 NOTE — Discharge Instructions (Addendum)
You are being transferred to the ED due to concern of peritonsillar abscess.

## 2022-08-23 NOTE — Consult Note (Signed)
Pharmacy Antibiotic Note  Peggy Klein is a 21 y.o. female admitted on 08/23/2022 with  tonsillitis .  No significant PMH. On exam, patient  has significant lymphadenopathy without marked leukocytosis or signs of sepsis. Pharmacy has been consulted for Unasyn dosing.  Plan: Day 1 of antibiotics Initiate Unasyn 3g Q6H  Continue to monitor renal function and culture results  Height: 5\' 4"  (162.6 cm) Weight: 68 kg (150 lb) IBW/kg (Calculated) : 54.7  Temp (24hrs), Avg:98.9 F (37.2 C), Min:98.7 F (37.1 C), Max:99.1 F (37.3 C)  Recent Labs  Lab 08/23/22 1152  WBC 9.2  CREATININE 0.62  LATICACIDVEN 1.4    Estimated Creatinine Clearance: 105.4 mL/min (by C-G formula based on SCr of 0.62 mg/dL).    Allergies  Allergen Reactions   Omnicef [Cefdinir] Other (See Comments)    Pt states "turns her stool red"    Antimicrobials this admission: 9/14 Zosyn x1 9/14 Unasyn >>   Dose adjustments this admission: N/A  Microbiology results: 9/14 BCx: in process  Thank you for allowing pharmacy to be a part of this patient's care.  10/14, PharmD PGY1 Pharmacy Resident 08/23/2022 4:34 PM

## 2022-08-23 NOTE — ED Provider Notes (Signed)
[facility]    CSN: [id number] Arrival date & time: 08/23/22  0853      History   Chief Complaint Chief Complaint  Patient presents with   Sore Throat    HPI Akeiba Axelson is a 21 y.o. female.   HPI   Zita presents for worsening sore throat for the past 2 to 3 days.  She noticed the pain first about 1 to 2 weeks ago.  She has pain with swallowing.  She feels more pain on the left side and none on the right.  She feels like her mouth is full of mucus and almost like she cannot breathe at night.  She was told previously she had tonsillitis that she may need to get her tonsils taken out.  She is took 2 home COVID test and they were negative last week.  There has been no fever, chills, chest discomfort, shortness of breath, difficulty breathing, no vomiting, nausea, diarrhea.  She endorses neck pain.  There is no headache, jaw pain.  She has been having trouble sleeping due to pain.  She tried Tylenol, Advil, Motrin and Aleve without relief.  No one else has similar symptoms.     History reviewed. No pertinent past medical history.  There are no problems to display for this patient.   History reviewed. No pertinent surgical history.  OB History   No obstetric history on file.      Home Medications    Prior to Admission medications   Medication Sig Start Date End Date Taking? Authorizing Provider  atomoxetine (STRATTERA) 40 MG capsule Take 40 mg by mouth every morning. 08/10/22   [provider]    Family History History reviewed. No pertinent family history.  Social History Social History   Tobacco Use   Smoking status: Never   Smokeless tobacco: Never  Vaping Use   Vaping Use: Never used  Substance Use Topics   Alcohol use: Not Currently   Drug use: Not Currently     Allergies   Omnicef [cefdinir]   Review of Systems Review of Systems: negative unless otherwise stated in HPI.      Physical Exam Triage Vital Signs ED  Triage Vitals  Enc Vitals Group     BP 08/23/22 0904 127/82     Pulse Rate 08/23/22 0904 100     Resp --      Temp 08/23/22 0904 99 F (37.2 C)     Temp Source 08/23/22 0904 Oral     SpO2 08/23/22 0904 96 %     Weight 08/23/22 0902 150 lb (68 kg)     Height 08/23/22 0902 5\' 4"  (1.626 m)     Head Circumference --      Peak Flow --      Pain Score 08/23/22 0901 10     Pain Loc --      Pain Edu? --      Excl. in GC? --    No data found.  Updated Vital Signs BP 127/82 (BP Location: Right Arm)   Pulse 100   Temp 99 F (37.2 C) (Oral)   Ht 5\' 4"  (1.626 m)   Wt 68 kg   SpO2 96%   BMI 25.75 kg/m   Visual Acuity Right Eye Distance:   Left Eye Distance:   Bilateral Distance:    Right Eye Near:   Left Eye Near:    Bilateral Near:     Physical Exam GEN:     alert, non-toxic  appearing female in no distress    HENT:  mucus membranes moist, petechiae on the hard palate.  Bilateral tonsillar hypertrophy 2-3+ right greater than left, no visible exudates, uvula midline and not edematous, moderate oropharyngeal erythema, bilateral TMs are erythematous but not opaque, mild erythematous and edematous turbinates EYES:   pupils equal and reactive, EOMi, no scleral injection NECK:  normal ROM, +lymphadenopathy, no meningismus   RESP:  no increased work of breathing, clear to auscultation bilaterally, speaking in full sentences without pause CVS:   regular rate and rhythm Skin:   warm and dry    UC Treatments / Results  Labs (all labs ordered are listed, but only abnormal results are displayed) Labs Reviewed  SARS CORONAVIRUS 2 BY RT PCR  GROUP A STREP BY PCR    EKG   Radiology No results found.  Procedures Procedures (including critical care time)  Medications Ordered in UC Medications - No data to display  Initial Impression / Assessment and Plan / UC Course  I have reviewed the triage vital signs and the nursing notes.  Pertinent labs & imaging results that were  available during my care of the patient were reviewed by me and considered in my medical decision making (see chart for details).       Pt is a 21 y.o. female with history of tonsillitis who presents for sore throat.Clemon Chambers has an elevated temperature here, 99 F.  Initial oxygen saturation 93% but on exam recheck was 99%.  She is without respiratory distress. Pulmonary exam is unremarkable. COVID testing obtained and was negative. Strep PCR negative.  On exam, she has a right-sided peritonsillar mass and bilateral tonsillar swelling.  I am concerned that she has a peritonsillar abscess.  Patient's mom states that she is in [place] and she is unable to come pick up her daughter.  Daughter took in over here and does not have a ride to the hospital.  EMS called for patient transport to the ED.  EMS updated upon arrival.   Discussed MDM, treatment plan and plan for follow-up with patient/parent who agrees with plan.     Final Clinical Impressions(s) / UC Diagnoses   Final diagnoses:  Pharyngitis, unspecified etiology     Discharge Instructions      You are being transferred to the ED due to concern of peritonsillar abscess.      ED Prescriptions   None    PDMP not reviewed this encounter.   Katha Cabal, DO 08/23/22 1059

## 2022-08-24 DIAGNOSIS — J038 Acute tonsillitis due to other specified organisms: Secondary | ICD-10-CM | POA: Diagnosis not present

## 2022-08-24 DIAGNOSIS — B2790 Infectious mononucleosis, unspecified without complication: Secondary | ICD-10-CM | POA: Diagnosis not present

## 2022-08-24 LAB — BASIC METABOLIC PANEL
Anion gap: 8 (ref 5–15)
BUN: <5 mg/dL — ABNORMAL LOW (ref 6–20)
CO2: 20 mmol/L — ABNORMAL LOW (ref 22–32)
Calcium: 8.3 mg/dL — ABNORMAL LOW (ref 8.9–10.3)
Chloride: 112 mmol/L — ABNORMAL HIGH (ref 98–111)
Creatinine, Ser: 0.59 mg/dL (ref 0.44–1.00)
GFR, Estimated: >60 mL/min (ref >60)
Glucose, Bld: 125 mg/dL — ABNORMAL HIGH (ref 70–99)
Potassium: 3.7 mmol/L (ref 3.5–5.1)
Sodium: 140 mmol/L (ref 135–145)

## 2022-08-24 LAB — CBC
HCT: 35.9 % — ABNORMAL LOW (ref 36.0–46.0)
Hemoglobin: 11.7 g/dL — ABNORMAL LOW (ref 12.0–15.0)
MCH: 27.6 pg (ref 26.0–34.0)
MCHC: 32.6 g/dL (ref 30.0–36.0)
MCV: 84.7 fL (ref 80.0–100.0)
Platelets: 249 10*3/uL (ref 150–400)
RBC: 4.24 MIL/uL (ref 3.87–5.11)
RDW: 14.5 % (ref 11.5–15.5)
WBC: 10.3 10*3/uL (ref 4.0–10.5)
nRBC: 0 % (ref 0.0–0.2)

## 2022-08-24 MED ORDER — AMOXICILLIN-POT CLAVULANATE 875-125 MG PO TABS: 1.0000 | ORAL_TABLET | Freq: Two times a day (BID) | ORAL | 0 refills | Status: AC

## 2022-08-24 MED ORDER — PREDNISONE 10 MG PO TABS: ORAL_TABLET | ORAL | 0 refills | Status: AC

## 2022-08-24 NOTE — Progress Notes (Signed)
Pt discharged per MD order. IV Removed. Discharge instructions reviewed with pt. Pt given work note. Pt declined wheelchair and walked out.

## 2022-08-24 NOTE — Plan of Care (Signed)

## 2022-08-24 NOTE — Consult Note (Signed)
Pharmacy Antibiotic Note  Peggy Klein is a 21 y.o. female admitted on 08/23/2022 with  tonsillitis .  No significant PMH. On exam, patient  has significant lymphadenopathy without marked leukocytosis or signs of sepsis. Pharmacy has been consulted for Unasyn dosing.  Plan: Day 2 of antibiotics Unasyn 3g Q6H  Continue to monitor renal function and culture results  Height: 5\' 4"  (162.6 cm) Weight: 68 kg (150 lb) IBW/kg (Calculated) : 54.7  Temp (24hrs), Avg:98.8 F (37.1 C), Min:98.4 F (36.9 C), Max:99.1 F (37.3 C)  Recent Labs  Lab 08/23/22 1152 08/23/22 1656 08/24/22 0434  WBC 9.2  --  10.3  CREATININE 0.62  --  0.59  LATICACIDVEN 1.4 1.2  --      Estimated Creatinine Clearance: 105.4 mL/min (by C-G formula based on SCr of 0.59 mg/dL).    Allergies  Allergen Reactions   Omnicef [Cefdinir] Other (See Comments)    Pt states "turns her stool red"    Antimicrobials this admission: 9/14 Zosyn x1 9/14 Unasyn >>   Dose adjustments this admission: N/A  Microbiology results: 9/14 BCx: NG<24hr  Thank you for allowing pharmacy to be a part of this patient's care.  10/14 PharmD Clinical Pharmacist 08/24/2022

## 2022-08-24 NOTE — Discharge Summary (Signed)
Physician Discharge Summary   Patient: Peggy Klein MRN: [id number] DOB: [birth year]  Admit date:     08/23/2022  Discharge date: 08/24/22  Discharge Physician: Lurene Shadow   PCP: System, Provider Not In   Recommendations at discharge:     Discharge Diagnoses: Principal Problem:   Acute tonsillitis  Resolved Problems:   * No resolved hospital problems. [facility] Course:  Ms. Peggy Klein is a 21 year old woman with history of recurrent tonsillitis who presented to the hospital because of 2-week history of nasal congestion, sore throat, painful swallowing and sensation of shortness of breath at night when she sleeps.  She had taken over-the-counter analgesics without any improvement.  Symptoms progressively got worse and she noticed painful swelling on the left side of her neck.  She was found to have acute palatine tonsillitis with left-sided cervical lymphadenopathy.  She was treated with empiric IV Unasyn, steroids and analgesics.  Mononucleosis screen was positive and EBV was likely the cause of her tonsillitis.  She was evaluated by Dr. Andee Poles, otolaryngologist, who recommended that patient be discharged on antibiotics because of concern for secondary bacterial infection of the tonsils.  Her condition has improved and she is deemed stable for discharge to home today. Discharge plan was discussed with patient and her girlfriend at the bedside.  She also requested a work note which was provided.          Consultants: ENT specialist Procedures performed: None Disposition: Home Diet recommendation:  Discharge Diet Orders (From admission, onward)     Start     Ordered   08/24/22 0000  Diet - low sodium heart healthy        08/24/22 1144           Cardiac diet DISCHARGE MEDICATION: Allergies as of 08/24/2022       Reactions   Omnicef [cefdinir] Other (See Comments)   Pt states "turns her stool red"        Medication List     TAKE these medications     amoxicillin-clavulanate 875-125 MG tablet Commonly known as: AUGMENTIN Take 1 tablet by mouth 2 (two) times daily for 7 days.   atomoxetine 40 MG capsule Commonly known as: STRATTERA Take 40 mg by mouth every morning.   predniSONE 10 MG tablet Commonly known as: DELTASONE Take 3 tablets (30 mg total) by mouth daily for 2 days, THEN 2 tablets (20 mg total) daily for 2 days, THEN 1 tablet (10 mg total) daily for 2 days. Start taking on: August 24, 2022        Follow-up Information     Vaught, Roney Mans, MD. Schedule an appointment as soon as possible for a visit in 1 week(s).   Specialty: Otolaryngology Why: recurrent tonsillitis Contact information: [address] [place] [address] [phone]                Discharge Exam: Ceasar Mons Weights   08/23/22 1112  Weight: 68 kg   GEN: NAD SKIN: Warm and dry EYES: No pallor or icterus ENT: MMM, enlarged tonsils on both sides, left tender cervical lymphadenopathy CV: RRR PULM: CTA B ABD: soft, ND, NT, +BS CNS: AAO x 3, non focal EXT: No edema or tenderness   Condition at discharge: good  The results of significant diagnostics from this hospitalization (including imaging, microbiology, ancillary and laboratory) are listed below for reference.   Imaging Studies: CT Soft Tissue Neck W Contrast  Result Date: 08/23/2022 CLINICAL DATA:  Left throat  pain for a few days, difficulty swallowing. EXAM: CT NECK WITH CONTRAST TECHNIQUE: Multidetector CT imaging of the neck was performed using the standard protocol following the bolus administration of intravenous contrast. RADIATION DOSE REDUCTION: This exam was performed according to the departmental dose-optimization program which includes automated exposure control, adjustment of the mA and/or kV according to patient size and/or use of iterative reconstruction technique. CONTRAST:  73mL OMNIPAQUE IOHEXOL 300 MG/ML  SOLN COMPARISON:  None  Available. FINDINGS: Pharynx and larynx: The nasal cavity and nasopharynx are unremarkable. Evaluation of the oral cavity is degraded by significant streak artifact from piercings and dental amalgam. There is swelling of the bilateral palatine tonsils with striated enhancement consistent with tonsillitis. There is a 1.7 cm region of hypodensity in the left palatine tonsil which may reflect phlegmon though there is no convincing well-defined rim suggest drainable abscess (3-30, 7-44). There is partial effacement of the oropharyngeal airway with mild mucosal edema extending on the along the left lateral oropharyngeal wall. The hypopharynx and larynx are unremarkable. The vocal folds are normal in appearance. There is no retropharyngeal fluid collection. Salivary glands: The parotid and submandibular glands are unremarkable. Thyroid: Unremarkable. Lymph nodes: There are prominent bilateral cervical chain lymph nodes, the largest on the left at level II measuring up to 2.0 cm in short axis. Vascular: The major vasculature of the neck is unremarkable. Limited intracranial: The imaged portions of the intracranial compartment are unremarkable. Visualized orbits: The imaged globes and orbits are unremarkable. Mastoids and visualized paranasal sinuses: There is a mucous retention cyst in the right maxillary sinus and moderate mucosal thickening with frothy secretions in the right sphenoid sinus. The mastoid air cells are clear. Skeleton: There is no acute osseous abnormality or suspicious osseous lesion. Upper chest: The imaged lung apices are clear. Other: None. IMPRESSION: 1. Swelling of the bilateral palatine tonsils consistent with tonsillitis with possible phlegmon on the left but no defined or drainable abscess. 2. Prominent cervical chain lymph nodes measuring up to 2.0 cm on the left, likely reactive. Recommend clinical follow-up to resolution. Electronically Signed   By: Lesia Hausen M.D.   On: 08/23/2022 13:34     Microbiology: Results for orders placed or performed during the hospital encounter of 08/23/22  Blood culture (single)     Status: None (Preliminary result)   Collection Time: 08/23/22 11:52 AM   Specimen: BLOOD RIGHT ARM  Result Value Ref Range Status   Specimen Description BLOOD RIGHT ARM  Final   Special Requests   Final    BOTTLES DRAWN AEROBIC AND ANAEROBIC Blood Culture results may not be optimal due to an inadequate volume of blood received in culture bottles   Culture   Final    NO GROWTH < 24 HOURS Performed at [facility], [address], [place], [place] [address]    Report Status PENDING  Incomplete    Labs: CBC: Recent Labs  Lab 08/23/22 1152 08/24/22 0434  WBC 9.2 10.3  NEUTROABS 4.2  --   HGB 12.1 11.7*  HCT 37.4 35.9*  MCV 85.2 84.7  PLT 261 249   Basic Metabolic Panel: Recent Labs  Lab 08/23/22 1152 08/24/22 0434  NA 138 140  K 3.6 3.7  CL 108 112*  CO2 22 20*  GLUCOSE 83 125*  BUN <5* <5*  CREATININE 0.62 0.59  CALCIUM 8.6* 8.3*   Liver Function Tests: No results for input(s): "AST", "ALT", "ALKPHOS", "BILITOT", "PROT", "ALBUMIN" in the last 168 hours. CBG: No results  for input(s): "GLUCAP" in the last 168 hours.  Discharge time spent: greater than 30 minutes.  Signed: Lurene Shadow, MD [facility] 08/24/2022

## 2022-08-28 LAB — CULTURE, BLOOD (SINGLE): Culture: NO GROWTH

## 2022-10-10 ENCOUNTER — Other Ambulatory Visit: Payer: Self-pay

## 2022-10-10 ENCOUNTER — Encounter: Payer: Self-pay | Admitting: Otolaryngology

## 2022-10-16 NOTE — Discharge Instructions (Signed)
T & A INSTRUCTION SHEET - [facility]  CREIGHTON VAUGHT, MD   INFORMATION SHEET FOR A TONSILLECTOMY AND ADENDOIDECTOMY  About Your Tonsils and Adenoids  The tonsils and adenoids are normal body tissues that are part of our immune system.  They normally help to protect us against diseases that may enter our mouth and nose. However, sometimes the tonsils and/or adenoids become too large and obstruct our breathing, especially at night.    If either of these things happen it helps to remove the tonsils and adenoids in order to become healthier. The operation to remove the tonsils and adenoids is called a tonsillectomy and adenoidectomy.  The Location of Your Tonsils and Adenoids  The tonsils are located in the back of the throat on both side and sit in a cradle of muscles. The adenoids are located in the roof of the mouth, behind the nose, and closely associated with the opening of the Eustachian tube to the ear.  Surgery on Tonsils and Adenoids  A tonsillectomy and adenoidectomy is a short operation which takes about thirty minutes.  This includes being put to sleep and being awakened. Tonsillectomies and adenoidectomies are performed at [facility] and may require observation period in the recovery room prior to going home. Children are required to remain in recovery for at least 45 minutes.   Following the Operation for a Tonsillectomy  A cautery machine is used to control bleeding. Bleeding from a tonsillectomy and adenoidectomy is minimal and postoperatively the risk of bleeding is approximately four percent, although this rarely life threatening.  After your tonsillectomy and adenoidectomy post-op care at home: 1. Our patients are able to go home the same day. You may be given prescriptions for pain medications, if indicated. 2. It is extremely important to remember that fluid intake is of utmost importance after a tonsillectomy. The  amount that you drink must be maintained in the postoperative period. A good indication of whether a child is getting enough fluid is whether his/her urine output is constant. As long as children are urinating or wetting their diaper every 6 - 8 hours this is usually enough fluid intake.   3. Although rare, this is a risk of some bleeding in the first ten days after surgery. This usually occurs between day five and nine postoperatively. This risk of bleeding is approximately four percent. If you or your child should have any bleeding you should remain calm and notify our office or go directly to the emergency room at [facility] where they will contact us. Our doctors are available seven days a week for notification. We recommend sitting up quietly in a chair, place an ice pack on the front of the neck and spitting out the blood gently until we are able to contact you. Adults should gargle gently with ice water and this may help stop the bleeding. If the bleeding does not stop after a short time, i.e. 10 to 15 minutes, or seems to be increasing again, please contact us or go to the hospital.   4. It is common for the pain to be worse at 5 - 7 days postoperatively. This occurs because the "scab" is peeling off and the mucous membrane (skin of the throat) is growing back where the tonsils were.   5. It is common for a low-grade fever, less than 102, during the first week after a tonsillectomy and adenoidectomy. It is usually due to not   drinking enough liquids, and we suggest your use liquid Tylenol (acetaminophen) or the pain medicine with Tylenol (acetaminophen) prescribed in order to keep your temperature below 102. Please follow the directions on the back of the bottle. 6. Recommendations for post-operative pain in children and adults: a) For Children 12 and younger: Recommendations are for oral Tylenol (acetaminophen) and oral Motrin (Ibuprofen) along with a prescription dose of  Prednisolone which is a steroid to help with pain and swelling. Administer the Tylenol (acetaminophen) and Motrin as stated on bottle for patient's age/weight. Sometimes it may be necessary to alternate the Tylenol (acetaminophen) and Motrin for improved pain control. Motrin does last slightly longer so many patients benefit from being given this prior to bedtime. All children should avoid Aspirin products for 2 weeks following surgery. b) For children over the age of 12: Tylenol (acetaminophen) is the preferred first choice for pain control. Depending on your child's size, sometimes they will be given a combination of Tylenol (acetaminophen) and hydrocodone medication or sometimes it will be recommended they take Motrin (ibuprofen) in addition to the Tylenol (acetaminophen). Narcotics should always be used with caution in children following surgery as they can suppress their breathing and switching to over the counter Tylenol (acetaminophen) and Motrin (ibuprofen) as soon as possible is recommended. All patients should avoid Aspirin products for 2 weeks following surgery. c) Adults: Usually adults will require a narcotic pain medication following a tonsillectomy. This usually has either hydrocodone or oxycodone in it and can usually be taken every 4 to 6 hours as needed for moderate pain. If the medication does not have Tylenol (acetaminophen) in it, you may also supplement Tylenol (acetaminophen) as needed every 4 to 6 hours for breakthrough or mild pain. Adults are also given Viscous Lidocaine to swish and spit every 6 hours to help with topical pain. Adults should avoid Aspirin, Aleve, Motrin, and Ibuprofen products for 2 weeks following surgery as they can increase your risk of bleeding. 7. If you happen to look in the mirror or into your child's mouth you will see white/gray patches on the back of the throat. This is what a scab looks like in the mouth and is normal after having a tonsillectomy and  adenoidectomy. They will disappear once the tonsil areas heal completely. However, it may cause a noticeable odor, and this too will disappear with time.     8. You or your child may experience ear pain after having a tonsillectomy and adenoidectomy.  This is called referred pain and comes from the throat, but it is felt in the ears.  Ear pain is quite common and expected. It will usually go away after ten days. There is usually nothing wrong with the ears, and it is primarily due to the healing area stimulating the nerve to the ear that runs along the side of the throat. Use either the prescribed pain medicine or Tylenol (acetaminophen) as needed.  9. The throat tissues after a tonsillectomy are obviously sensitive. Smoking around children who have had a tonsillectomy significantly increases the risk of bleeding. DO NOT SMOKE!  What to Expect Each Day  First Day at Home 1. Patients will be discharged home the same day.  2. Drink at least four glasses of liquid a day. Clear, cool liquids are recommended. Fruit juices containing citric acid are not recommended because they tend to cause pain. Carbonated beverages are allowed if you pour them from glass to glass to remove the bubbles as these tend to cause   discomfort. Avoid alcoholic beverages.  3. Eat very soft foods such as soups, broth, jello, custard, pudding, ice cream, popsicles, applesauce, mashed potatoes, and in general anything that you can crush between your tongue and the roof of your mouth. Try adding Carnation Instant Breakfast Mix into your food for extra calories. It is not uncommon to lose 5 to 10 pounds of fluid weight. The weight will be gained back quickly once you're feeling better and drinking more.  4. Sleep with your head elevated on two pillows for about three days to help decrease the swelling.  5. DO NOT SMOKE!  Day Two  1. Rest as much as possible. Use common sense in your activities.  2. Continue drinking at least four glasses  of liquid per day.  3. Follow the soft diet.  4. Use your pain medication as needed.  Day Three  1. Advance your activity as you are able and continue to follow the previous day's suggestions.  Days Four Through Six  1. Advance your diet and begin to eat more solid foods such as chopped hamburger. 2. Advance your activities slowly. Children should be kept mostly around the house.  3. Not uncommonly, there will be more pain at this time. It is temporary, usually lasting a day or two.  Day Seven Through Ten  1. Most individuals by this time are able to return to work or school unless otherwise instructed. Consider sending children back to school for a half day on the first day back. 

## 2022-10-17 ENCOUNTER — Ambulatory Visit: Payer: Medicaid Other | Admitting: Anesthesiology

## 2022-10-17 ENCOUNTER — Ambulatory Visit
Admission: RE | Admit: 2022-10-17 | Discharge: 2022-10-17 | Disposition: A | Discharge status: Home / Self Care | Payer: Medicaid Other | Attending: Otolaryngology | Admitting: Otolaryngology

## 2022-10-17 ENCOUNTER — Encounter
Admission: RE | Disposition: A | Discharge status: Home / Self Care | Payer: Self-pay | Source: Home / Self Care | Attending: Otolaryngology

## 2022-10-17 ENCOUNTER — Other Ambulatory Visit: Payer: Self-pay

## 2022-10-17 DIAGNOSIS — J3501 Chronic tonsillitis: Secondary | ICD-10-CM | POA: Diagnosis present

## 2022-10-17 DIAGNOSIS — R599 Enlarged lymph nodes, unspecified: Secondary | ICD-10-CM | POA: Insufficient documentation

## 2022-10-17 HISTORY — PX: TONSILLECTOMY: SHX5217

## 2022-10-17 HISTORY — DX: Attention-deficit hyperactivity disorder, unspecified type: F90.9

## 2022-10-17 HISTORY — DX: Cardiac murmur, unspecified: R01.1

## 2022-10-17 HISTORY — DX: Gastro-esophageal reflux disease without esophagitis: K21.9

## 2022-10-17 LAB — POCT PREGNANCY, URINE: Preg Test, Ur: NEGATIVE

## 2022-10-17 SURGERY — TONSILLECTOMY
Anesthesia: General | Site: Throat

## 2022-10-17 MED ORDER — FENTANYL CITRATE (PF) 100 MCG/2ML IJ SOLN
INTRAMUSCULAR | Status: DC | PRN
  Administered 2022-10-17 (×2): 50 ug via INTRAVENOUS

## 2022-10-17 MED ORDER — LIDOCAINE HCL (CARDIAC) PF 100 MG/5ML IV SOSY
PREFILLED_SYRINGE | INTRAVENOUS | Status: DC | PRN
  Administered 2022-10-17: 40 mg via INTRAVENOUS

## 2022-10-17 MED ORDER — OXYCODONE HCL 5 MG PO TABS
5.0000 mg | ORAL_TABLET | Freq: Once | ORAL | Status: AC | PRN
  Administered 2022-10-17: 5 mg via ORAL

## 2022-10-17 MED ORDER — SUCCINYLCHOLINE CHLORIDE 200 MG/10ML IV SOSY
PREFILLED_SYRINGE | INTRAVENOUS | Status: DC | PRN
  Administered 2022-10-17: 70 mg via INTRAVENOUS

## 2022-10-17 MED ORDER — OXYCODONE HCL 5 MG/5ML PO SOLN: 5.0000 mg | ORAL | 0 refills | Status: AC | PRN

## 2022-10-17 MED ORDER — OXYCODONE HCL 5 MG/5ML PO SOLN: 5.0000 mg | Freq: Once | ORAL | Status: AC | PRN

## 2022-10-17 MED ORDER — ONDANSETRON HCL 4 MG/2ML IJ SOLN
4.0000 mg | Freq: Once | INTRAMUSCULAR | Status: AC | PRN
  Administered 2022-10-17: 4 mg via INTRAVENOUS

## 2022-10-17 MED ORDER — DEXAMETHASONE SODIUM PHOSPHATE 4 MG/ML IJ SOLN
INTRAMUSCULAR | Status: DC | PRN
  Administered 2022-10-17: 8 mg via INTRAVENOUS

## 2022-10-17 MED ORDER — GLYCOPYRROLATE 0.2 MG/ML IJ SOLN
INTRAMUSCULAR | Status: DC | PRN
  Administered 2022-10-17: .2 mg via INTRAVENOUS

## 2022-10-17 MED ORDER — LACTATED RINGERS IV SOLN: INTRAVENOUS | Status: DC

## 2022-10-17 MED ORDER — LIDOCAINE VISCOUS HCL 2 % MT SOLN: 10.0000 mL | Freq: Four times a day (QID) | OROMUCOSAL | 0 refills | Status: AC | PRN

## 2022-10-17 MED ORDER — BUPIVACAINE HCL (PF) 0.25 % IJ SOLN
INTRAMUSCULAR | Status: DC | PRN
  Administered 2022-10-17: 2 mL

## 2022-10-17 MED ORDER — OXYMETAZOLINE HCL 0.05 % NA SOLN
NASAL | Status: DC | PRN
  Administered 2022-10-17: 1 via TOPICAL

## 2022-10-17 MED ORDER — PROPOFOL 10 MG/ML IV BOLUS
INTRAVENOUS | Status: DC | PRN
  Administered 2022-10-17: 15050 mg via INTRAVENOUS

## 2022-10-17 MED ORDER — ONDANSETRON HCL 4 MG PO TABS: 4.0000 mg | ORAL_TABLET | Freq: Three times a day (TID) | ORAL | 0 refills | Status: AC | PRN

## 2022-10-17 MED ORDER — DEXMEDETOMIDINE HCL IN NACL 80 MCG/20ML IV SOLN
INTRAVENOUS | Status: DC | PRN
  Administered 2022-10-17: 8 ug via BUCCAL

## 2022-10-17 MED ORDER — MIDAZOLAM HCL 5 MG/5ML IJ SOLN
INTRAMUSCULAR | Status: DC | PRN
  Administered 2022-10-17: 2 mg via INTRAVENOUS

## 2022-10-17 MED ORDER — FENTANYL CITRATE PF 50 MCG/ML IJ SOSY: 25.0000 ug | PREFILLED_SYRINGE | INTRAMUSCULAR | Status: DC | PRN

## 2022-10-17 MED ORDER — ACETAMINOPHEN 500 MG PO TABS
1000.0000 mg | ORAL_TABLET | Freq: Once | ORAL | Status: AC
  Administered 2022-10-17: 1000 mg via ORAL

## 2022-10-17 MED ORDER — ACETAMINOPHEN 160 MG/5ML PO SOLN: 960.0000 mg | Freq: Once | ORAL | Status: AC

## 2022-10-17 SURGICAL SUPPLY — 17 items
BLADE ELECT COATED/INSUL 125 (ELECTRODE) ×2 IMPLANT
CANISTER SUCT 1200ML W/VALVE (MISCELLANEOUS) ×2 IMPLANT
CATH ROBINSON RED A/P 10FR (CATHETERS) ×2 IMPLANT
ELECT REM PT RETURN 9FT ADLT (ELECTROSURGICAL) ×2
ELECTRODE REM PT RTRN 9FT ADLT (ELECTROSURGICAL) ×2 IMPLANT
GLOVE SURG GAMMEX PI TX LF 7.5 (GLOVE) ×2 IMPLANT
HANDLE SUCTION POOLE (INSTRUMENTS) ×2 IMPLANT
KIT TURNOVER KIT A (KITS) ×2 IMPLANT
PACK TONSIL AND ADENOID CUSTOM (PACKS) ×2 IMPLANT
PENCIL SMOKE EVACUATOR (MISCELLANEOUS) ×2 IMPLANT
SLEEVE SUCTION 125 (MISCELLANEOUS) ×2 IMPLANT
SOL ANTI-FOG 6CC FOG-OUT (MISCELLANEOUS) ×2 IMPLANT
SOL FOG-OUT ANTI-FOG 6CC (MISCELLANEOUS) ×2
STRAP BODY AND KNEE 60X3 (MISCELLANEOUS) ×2 IMPLANT
SUCTION COAG ELEC 10 HAND CTRL (ELECTROSURGICAL) ×1 IMPLANT
SUCTION POOLE HANDLE (INSTRUMENTS) ×2
SYR 5ML LL (SYRINGE) ×2 IMPLANT

## 2022-10-17 NOTE — Anesthesia Postprocedure Evaluation (Signed)
Anesthesia Post Note  Patient: Peggy Klein  Procedure(s) Performed: TONSILLECTOMY (Throat)  Patient location during evaluation: PACU Anesthesia Type: General Level of consciousness: awake and alert Pain management: pain level controlled Vital Signs Assessment: post-procedure vital signs reviewed and stable Respiratory status: spontaneous breathing, nonlabored ventilation, respiratory function stable and patient connected to nasal cannula oxygen Cardiovascular status: blood pressure returned to baseline and stable Postop Assessment: no apparent nausea or vomiting Anesthetic complications: no   No notable events documented.   Last Vitals:  Vitals:   10/17/22 0952 10/17/22 1000  BP:  116/81  Pulse: 70 (!) 106  Resp: 13 (!) 24  Temp: (!) 36.4 C (!) 36.4 C  SpO2: 100% 100%    Last Pain:  Vitals:   10/17/22 0940  TempSrc:   PainSc: 6                  Corinda Gubler

## 2022-10-17 NOTE — Transfer of Care (Signed)
Immediate Anesthesia Transfer of Care Note  Patient: Peggy Klein  Procedure(s) Performed: TONSILLECTOMY (Throat)  Patient Location: PACU  Anesthesia Type: General  Level of Consciousness: awake, alert  and patient cooperative  Airway and Oxygen Therapy: Patient Spontanous Breathing and Patient connected to supplemental oxygen  Post-op Assessment: Post-op Vital signs reviewed, Patient's Cardiovascular Status Stable, Respiratory Function Stable, Patent Airway and No signs of Nausea or vomiting  Post-op Vital Signs: Reviewed and stable  Complications: No notable events documented.

## 2022-10-17 NOTE — Anesthesia Preprocedure Evaluation (Signed)
Anesthesia Evaluation  Patient identified by MRN, date of birth, ID band Patient awake    Reviewed: Allergy & Precautions, NPO status , Patient's Chart, lab work & pertinent test results  History of Anesthesia Complications Negative for: history of anesthetic complications  Airway Mallampati: II  TM Distance: >3 FB Neck ROM: Full    Dental no notable dental hx. (+) Teeth Intact   Pulmonary neg pulmonary ROS, neg sleep apnea, neg COPD, neg recent URI, Patient abstained from smoking.Not current smoker   Pulmonary exam normal breath sounds clear to auscultation       Cardiovascular Exercise Tolerance: Good METS(-) hypertension(-) CAD and (-) Past MI negative cardio ROS (-) dysrhythmias  Rhythm:Regular Rate:Normal - Systolic murmurs    Neuro/Psych  PSYCHIATRIC DISORDERS      negative neurological ROS     GI/Hepatic ,GERD  Controlled,,(+)     (-) substance abuse    Endo/Other  neg diabetes    Renal/GU negative Renal ROS     Musculoskeletal   Abdominal   Peds  Hematology   Anesthesia Other Findings Past Medical History: No date: ADHD (attention deficit hyperactivity disorder) No date: GERD (gastroesophageal reflux disease) No date: Heart murmur  Reproductive/Obstetrics                             Anesthesia Physical Anesthesia Plan  ASA: 2  Anesthesia Plan: General   Post-op Pain Management: Ofirmev IV (intra-op)   Induction: Intravenous  PONV Risk Score and Plan: 4 or greater and Ondansetron, Dexamethasone and Midazolam  Airway Management Planned: Oral ETT  Additional Equipment: None  Intra-op Plan:   Post-operative Plan: Extubation in OR  Informed Consent: I have reviewed the patients History and Physical, chart, labs and discussed the procedure including the risks, benefits and alternatives for the proposed anesthesia with the patient or authorized representative who has  indicated his/her understanding and acceptance.     Dental advisory given  Plan Discussed with: CRNA and Surgeon  Anesthesia Plan Comments: (Discussed risks of anesthesia with patient, including PONV, sore throat, lip/dental/eye damage. Rare risks discussed as well, such as cardiorespiratory and neurological sequelae, and allergic reactions. Discussed the role of CRNA in patient's perioperative care. Patient understands.)       Anesthesia Quick Evaluation

## 2022-10-17 NOTE — H&P (Signed)
..  History and Physical paper copy reviewed and updated date of procedure and will be scanned into system.  Patient seen and examined.  

## 2022-10-17 NOTE — Anesthesia Procedure Notes (Signed)
Procedure Name: Intubation Date/Time: 10/17/2022 8:53 AM  Performed by: Londell Moh, CRNAPre-anesthesia Checklist: Patient identified, Emergency Drugs available, Suction available, Patient being monitored and Timeout performed Patient Re-evaluated:Patient Re-evaluated prior to induction Oxygen Delivery Method: Circle system utilized Preoxygenation: Pre-oxygenation with 100% oxygen Induction Type: IV induction Ventilation: Mask ventilation without difficulty Laryngoscope Size: Mac and 3 Grade View: Grade I Tube type: Oral Rae Tube size: 7.0 mm Number of attempts: 1 Placement Confirmation: ETT inserted through vocal cords under direct vision, positive ETCO2 and breath sounds checked- equal and bilateral Tube secured with: Tape Dental Injury: Teeth and Oropharynx as per pre-operative assessment

## 2022-10-17 NOTE — Op Note (Signed)
..  10/17/2022  9:12 AM    Park Liter  [id number]   Pre-Op Dx:  Chronic Tonsilitis  Post-op Dx: Chronic Tonsilitis  Proc:Tonsillectomy > age 21  Surg: Jamian Andujo  Anes:  General Endotracheal  EBL:  <56ml  Comp:  None  Findings:  3+ cryptic tonsils with tonsillolithiasis, no adenoids present  Procedure: After the patient was identified in holding and the history and physical and consent was reviewed, the patient was taken to the operating room and placed in a supine position.  General endotracheal anesthesia was induced in the normal fashion.  At this time, the patient was rotated 45 degrees and a shoulder roll was placed.  At this time, a McIvor mouthgag was inserted into the patient's oral cavity and suspended from the Mayo stand without injury to teeth, lips, or gums.  Next a red rubber catheter was inserted into the patient left nostril for retraction of the uvula and soft palate superiorly.  Next a curved Alice clamp was attached to the patient's right superior tonsillar pole and retracted medially and inferiorly.  A Bovie electrocautery was used to dissect the patient's right tonsil in a subcapsular plane.  Meticulous hemostasis was achieved with Bovie suction cautery.  At this time, the mouth gag was released from suspension for 1 minute.  Attention now was directed to the patient's left side.  In a similar fashion the curved Alice clamp was attached to the superior pole and this was retracted medially and inferiorly and the tonsil was excised in a subcapsular plane with Bovie electrocautery.  After completion of the second tonsil, meticulous hemostasis was continued.  At this time, attention was directed to the patient's Adenoids.  Under indirect visualization using an operating mirror, the adenoid as absent so no adenoidectomy was performed.  At this time, the patient's nasal cavity and oral cavity was irrigated with sterile saline.  20ml of 0.25% Marcaine was  injected into the anterior and posterior tonsillar fossa bilaterally.  Following this  The care of patient was returned to anesthesia, awakened, and transferred to recovery in stable condition.  Dispo:  PACU to home  Plan: Soft diet.  Limit exercise and strenuous activity for 2 weeks.  Fluid hydration  Recheck my office three weeks.   Sami Froh 9:12 AM 10/17/2022

## 2022-10-18 ENCOUNTER — Encounter: Payer: Self-pay | Admitting: Otolaryngology

## 2022-10-18 MED ORDER — PROPOFOL 10 MG/ML IV BOLUS
INTRAVENOUS | Status: DC | PRN
  Administered 2022-10-17: 150 mg via INTRAVENOUS

## 2022-10-18 NOTE — Addendum Note (Signed)
Addendum  created 10/18/22 0754 by Reed Breech, MD   Intraprocedure Meds edited

## 2022-10-19 LAB — SURGICAL PATHOLOGY

## 2023-04-15 ENCOUNTER — Ambulatory Visit
Admission: EM | Admit: 2023-04-15 | Discharge: 2023-04-15 | Disposition: A | Discharge status: Home / Self Care | Payer: Medicaid Other

## 2023-04-15 ENCOUNTER — Encounter: Payer: Self-pay | Admitting: Emergency Medicine

## 2023-04-15 DIAGNOSIS — L0591 Pilonidal cyst without abscess: Secondary | ICD-10-CM | POA: Diagnosis not present

## 2023-04-15 MED ORDER — AMOXICILLIN-POT CLAVULANATE 875-125 MG PO TABS: 1.0000 | ORAL_TABLET | Freq: Two times a day (BID) | ORAL | 0 refills | Status: AC

## 2023-04-15 MED ORDER — MUPIROCIN 2 % EX OINT: 1.0000 | TOPICAL_OINTMENT | Freq: Two times a day (BID) | CUTANEOUS | 0 refills | Status: AC

## 2023-04-15 NOTE — ED Triage Notes (Signed)
Pt presents with an abscess on her buttock for 2 days.

## 2023-04-15 NOTE — Discharge Instructions (Addendum)
-  Small pilonidal. No drainage needed at this time. Oral antibiotics should treat this and be improving the situation in the next 3 days.  -Take warm soaks multiple times daily when able. -Apply ointment 3x daily. -If no improvement in 3 days or symptoms worsen at any time return for re-evaluation.

## 2023-04-15 NOTE — ED Provider Notes (Signed)
[facility]    CSN: [id number] Arrival date & time: 04/15/23  1650      History   Chief Complaint Chief Complaint  Patient presents with   Abscess    HPI Peggy Klein is a 22 y.o. female presenting for concerns about abscess or cyst of gluteal cleft has been present for the past 2 days.  Patient reports similar symptoms 2 years ago when she had a pilonidal cyst drained.  She says it has not changed in size over the past couple of days.  Has not drained or bled.  She has been try to keep the area clean.  Not taking any meds for symptoms.  No fever.  No other complaints.  HPI  Past Medical History:  Diagnosis Date   ADHD (attention deficit hyperactivity disorder)    GERD (gastroesophageal reflux disease)    Heart murmur     Patient Active Problem List   Diagnosis Date Noted   Acute tonsillitis 08/23/2022    Past Surgical History:  Procedure Laterality Date   TONSILLECTOMY N/A 10/17/2022   Procedure: TONSILLECTOMY;  Surgeon: Bud Face, MD;  [facility]: [facility];  Service: ENT;  Laterality: N/A;    OB History   No obstetric history on file.      Home Medications    Prior to Admission medications   Medication Sig Start Date End Date Taking? Authorizing Provider  amoxicillin-clavulanate (AUGMENTIN) 875-125 MG tablet Take 1 tablet by mouth every 12 (twelve) hours for 10 days. 04/15/23 04/25/23 Yes Shirlee Latch, PA-C  atomoxetine (STRATTERA) 40 MG capsule Take 40 mg by mouth daily. 08/10/22  Yes [provider]  etonogestrel (NEXPLANON) 68 MG IMPL implant 1 each by Subdermal route once.   Yes [provider]  mupirocin ointment (BACTROBAN) 2 % Apply 1 Application topically 2 (two) times daily. 04/15/23  Yes Eusebio Friendly B, PA-C  lidocaine (XYLOCAINE) 2 % solution Use as directed 10 mLs in the mouth or throat every 6 (six) hours as needed for mouth pain (Swish and spit). 10/17/22   Vaught, Roney Mans, MD  ondansetron (ZOFRAN) 4  MG tablet Take 1 tablet (4 mg total) by mouth every 8 (eight) hours as needed for up to 10 doses for nausea or vomiting. 10/17/22   Vaught, Roney Mans, MD  oxyCODONE (ROXICODONE) 5 MG/5ML solution Take 5 mLs (5 mg total) by mouth every 4 (four) hours as needed for severe pain. 10/17/22   Bud Face, MD    Family History No family history on file.  Social History Social History   Tobacco Use   Smoking status: Never   Smokeless tobacco: Never  Vaping Use   Vaping Use: Never used  Substance Use Topics   Alcohol use: Not Currently    Comment: occasionally   Drug use: Not Currently     Allergies   Omnicef [cefdinir]   Review of Systems Review of Systems  Constitutional:  Negative for fatigue and fever.  Musculoskeletal:  Negative for arthralgias and joint swelling.  Skin:  Positive for color change. Negative for wound.  Neurological:  Negative for weakness.     Physical Exam Triage Vital Signs ED Triage Vitals  Enc Vitals Group     BP      Pulse      Resp      Temp      Temp src      SpO2      Weight      Height  Head Circumference      Peak Flow      Pain Score      Pain Loc      Pain Edu?      Excl. in GC?    No data found.  Updated Vital Signs BP 112/60 (BP Location: Left Arm)   Pulse 87   Temp 98.8 F (37.1 C) (Oral)   Resp 16   SpO2 100%       Physical Exam Vitals and nursing note reviewed.  Constitutional:      General: She is not in acute distress.    Appearance: Normal appearance. She is not ill-appearing or toxic-appearing.  HENT:     Head: Normocephalic and atraumatic.  Eyes:     General: No scleral icterus.       Right eye: No discharge.        Left eye: No discharge.     Conjunctiva/sclera: Conjunctivae normal.  Cardiovascular:     Rate and Rhythm: Normal rate and regular rhythm.  Pulmonary:     Effort: Pulmonary effort is normal. No respiratory distress.  Musculoskeletal:     Cervical back: Neck supple.  Skin:     General: Skin is dry.     Findings: Abscess present.     Comments: Small area of swelling and slight erythema gluteal cleft/right upper buttocks. TTP. No drainage.  Neurological:     General: No focal deficit present.     Mental Status: She is alert. Mental status is at baseline.     Motor: No weakness.     Gait: Gait normal.  Psychiatric:        Mood and Affect: Mood normal.        Behavior: Behavior normal.        Thought Content: Thought content normal.      UC Treatments / Results  Labs (all labs ordered are listed, but only abnormal results are displayed) Labs Reviewed - No data to display  EKG   Radiology No results found.  Procedures Procedures (including critical care time)  Medications Ordered in UC Medications - No data to display  Initial Impression / Assessment and Plan / UC Course  I have reviewed the triage vital signs and the nursing notes.  Pertinent labs & imaging results that were available during my care of the patient were reviewed by me and considered in my medical decision making (see chart for details).   22 year old female presents for small pilonidal cyst/abscess that it has been present for the past 2 days.  It has not grown in size in the past 2 days.  No associated fever.  No drainage.  History of similar symptoms a couple of years ago.  Patient had I&D 2 years ago for pilonidal cyst.  On exam today the pilonidal infection is mild.  Will hold off on I&D at this time as symptoms are mild.  Will treat with Augmentin and mupirocin ointment.  Discussed keeping the area clean and performing sitz bath's.  Reviewed if no improvement in the next couple days or symptoms were to worsen at all to return for reevaluation and consideration of incision and drainage or referral to general surgery.   Final Clinical Impressions(s) / UC Diagnoses   Final diagnoses:  Pilonidal cyst     Discharge Instructions      -Small pilonidal. No drainage needed at this  time. Oral antibiotics should treat this and be improving the situation in the next 3 days.  -Take warm  soaks multiple times daily when able. -Apply ointment 3x daily. -If no improvement in 3 days or symptoms worsen at any time return for re-evaluation.      ED Prescriptions     Medication Sig Dispense Auth. Provider   amoxicillin-clavulanate (AUGMENTIN) 875-125 MG tablet Take 1 tablet by mouth every 12 (twelve) hours for 10 days. 20 tablet Eusebio Friendly B, PA-C   mupirocin ointment (BACTROBAN) 2 % Apply 1 Application topically 2 (two) times daily. 22 g Shirlee Latch, PA-C      PDMP not reviewed this encounter.   Shirlee Latch, PA-C 04/15/23 1819

## 2024-09-07 ENCOUNTER — Other Ambulatory Visit: Payer: Self-pay | Admitting: *Deleted

## 2024-09-07 DIAGNOSIS — O280 Abnormal hematological finding on antenatal screening of mother: Secondary | ICD-10-CM
